# Patient Record
Sex: Male | Born: 1955 | Race: White | Hispanic: No | Marital: Married | Smoking: Never smoker
Health system: Southern US, Community
[De-identification: ages and names within clinical notes are randomized; demographics above are authoritative.]

## PROBLEM LIST (undated history)

## (undated) DIAGNOSIS — N400 Enlarged prostate without lower urinary tract symptoms: Secondary | ICD-10-CM

## (undated) DIAGNOSIS — I10 Essential (primary) hypertension: Secondary | ICD-10-CM

## (undated) DIAGNOSIS — E785 Hyperlipidemia, unspecified: Secondary | ICD-10-CM

## (undated) DIAGNOSIS — I2510 Atherosclerotic heart disease of native coronary artery without angina pectoris: Secondary | ICD-10-CM

## (undated) DIAGNOSIS — K219 Gastro-esophageal reflux disease without esophagitis: Secondary | ICD-10-CM

---

## 2005-09-26 ENCOUNTER — Emergency Department (HOSPITAL_COMMUNITY): Admission: EM | Admit: 2005-09-26 | Discharge: 2005-09-26 | Payer: Self-pay | Admitting: Emergency Medicine

## 2006-12-13 ENCOUNTER — Encounter: Admission: RE | Admit: 2006-12-13 | Discharge: 2006-12-13 | Payer: Self-pay | Admitting: Neurosurgery

## 2006-12-19 ENCOUNTER — Inpatient Hospital Stay (HOSPITAL_COMMUNITY): Admission: AD | Admit: 2006-12-19 | Discharge: 2006-12-20 | Payer: Self-pay | Admitting: Neurosurgery

## 2009-03-15 ENCOUNTER — Encounter: Admission: RE | Admit: 2009-03-15 | Discharge: 2009-03-15 | Payer: Self-pay | Admitting: Podiatry

## 2010-06-21 ENCOUNTER — Ambulatory Visit (HOSPITAL_COMMUNITY): Admission: RE | Admit: 2010-06-21 | Discharge: 2010-06-21 | Payer: Self-pay | Admitting: Podiatry

## 2010-12-17 ENCOUNTER — Encounter: Payer: Self-pay | Admitting: Podiatry

## 2011-04-13 NOTE — Op Note (Signed)
NAMEBEAUDEN, TREMONT NO.:  [id number]   MEDICAL RECORD NO.:  [id number]          PATIENT TYPE:  AMB   LOCATION:  SDS                          FACILITY:  [facility]   PHYSICIAN:  Clydene Fake, M.D.  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   PROCEDURES:  Anterior cervical decompression and diskectomy and fusion,  C5-6 and 6-7 with LifeMed allograft bone and Trestle anterior cervical  plate.   SURGEON:  Clydene Fake, M.D.   ASSISTANT:  Stefani Dama, M.D.   ANESTHESIA:  General endotracheal tube anesthesia.   ESTIMATED BLOOD LOSS:  Minimal.   BLOOD GIVEN:  None.   BLOOD LOSS:  None.   DRAINS:  None.   COMPLICATIONS:  None.   REASON FOR PROCEDURES:  The patient is a 55 year old gentleman, who has  had right arm pain and numbness that has not improved, and actually  worsened.  MRI was done showing rather severe spondylytic changes with  spurring and disk herniation at 5-6 and spondylytic changes at 6-7,  causing the  problem, greater on the left than on the right.  The  patient was brought in for decompression and fusion.   PROCEDURE IN DETAIL:  The patient was brought in the operating room and  general anesthesia induced.  The patient was placed in 10 pounds of  halter traction and prepped and draped in a sterile fashion.  The site  of incision was injected with 10 mL of 1% lidocaine with epinephrine.  An incision was then made in the midline to the anterior border of the  sternocleidomastoid on the left side of the neck at the site that had a  previous scar from thyroid surgery.  The incision was taken down through  the fascia to  the platysma, and hemostasis was obtained with Bovie  cauterization.  Carefully dissecting through the platysma, blunt  dissection was taken to the anterior cervical fascia to the anterior  cervical spine.  A needle was placed in the interspace and x-ray was  obtained.  The needle was in the 5-6 space.  We did another 2 x-rays, 1  with the needle up in 4-5 too, trying to confirm this.  We did confirm  that we were at the 5-6 space.  The disk space was incised with a 15  blade as the needle was removed.  Partial diskectomy was performed.  The  longus colli muscles were reflected laterally from C5 through 7.  The  self-retaining retractor was placed.  The disk spaces were then again  incised with a 15 blade and diskectomy performed at 5-6 and 6-7 with  pituitary rongeurs and curets.  Anterior osteophytes were removed with  Kerrison punches.  Distraction pins were placed in C5 and C7 and the  interspaces were distracted.  The  microscope was brought in for  microdissection.  Starting at 5-6, and, again, we continued to use  curets and pituitary rongeurs to remove the disk material, and 2 and 3-  mm Kerrisons were used to remove osteophytes.  As we got down near the  posterior disk and ligament, 1-mm Kerrison punches were used to start  removing this, and this was finished with the 2-mm Kerrison punches,  decompressing the central canal, and then performing bilateral  foraminotomies.  There was significant spondylytic change and disk in  the right foramen causing some nerve root compression.  When we were  finished, we had good decompression of both canals.  Gelfoam with  thrombin was placed in the disk space, and attention was then taken to 6-  7 and diskectomy continued there with pituitary rongeurs and curets, and  1 and 2-mm Kerrison punches were used to remove posterior disk,  posterior ligament and osteophytes, getting a good central decompression  and bilateral foraminotomies.  Then we  irrigated with antibiotic  solution and we got hemostasis with Gelfoam and thrombin.  We removed  the Gelfoam and thrombin from both spaces.  We measured the height of  the disk spaces to be 6 mm, and we roughened up the endplates with  the  LifeMed broaches for a 6-mm graft, and tapped a 6-mm bone plug into the  5-6 annulus and 6-7 level, countersinking it a millimeter or 2 and  checking posterior to the graft with a nerve hook, making sure there was  plenty of room between the bone graft and the dura.  We irrigated with  antibiotic solution.  The distraction and distraction pins were removed.  Hemostasis with Gelfoam and thrombin was done.  We removed the weight  from the traction and the bones were firmly in good position.  Anterior  osteophytes were removed with a Leksell rongeur and a Trestle anterior  cervical plate was placed over the anterior cervical spine.  Two screws  were placed into C5, 2 into C6 and 2 into C7, and these were tightened  down.  Lateral x-rays were obtained, and we could just see the screws in  C5 at the beginning of the plate due to the shoulders and body habitus.  We irrigated with antibiotic solution.  We had very good hemostasis, and  the platysma was closed with 3-0 Vicryl interrupted sutures.  The  subcutaneous tissues were closed with the same, and the skin closed with  benzoin and Steri-Strips.  A dressing was placed.  The patient was  placed in a soft cervical collar, awakened from anesthesia and  transferred to the recovery room in stable condition.           ______________________________  Clydene Fake, M.D.     JRH/MEDQ  D:  12/19/2006  T:  12/19/2006  Job:  [id number]

## 2015-11-27 HISTORY — PX: CORONARY ANGIOPLASTY WITH STENT PLACEMENT: SHX49

## 2016-08-26 DIAGNOSIS — I2510 Atherosclerotic heart disease of native coronary artery without angina pectoris: Secondary | ICD-10-CM

## 2016-08-26 HISTORY — DX: Atherosclerotic heart disease of native coronary artery without angina pectoris: I25.10

## 2018-09-08 ENCOUNTER — Observation Stay (HOSPITAL_COMMUNITY)
Admission: EM | Admit: 2018-09-08 | Discharge: 2018-09-10 | Disposition: A | Discharge status: Home / Self Care | Payer: 59 | Attending: Internal Medicine | Admitting: Internal Medicine

## 2018-09-08 ENCOUNTER — Emergency Department (HOSPITAL_COMMUNITY): Payer: 59

## 2018-09-08 ENCOUNTER — Encounter (HOSPITAL_COMMUNITY): Payer: Self-pay | Admitting: Emergency Medicine

## 2018-09-08 DIAGNOSIS — K219 Gastro-esophageal reflux disease without esophagitis: Secondary | ICD-10-CM | POA: Diagnosis not present

## 2018-09-08 DIAGNOSIS — Z955 Presence of coronary angioplasty implant and graft: Secondary | ICD-10-CM | POA: Diagnosis not present

## 2018-09-08 DIAGNOSIS — Z7982 Long term (current) use of aspirin: Secondary | ICD-10-CM | POA: Diagnosis not present

## 2018-09-08 DIAGNOSIS — N183 Chronic kidney disease, stage 3 unspecified: Secondary | ICD-10-CM

## 2018-09-08 DIAGNOSIS — E669 Obesity, unspecified: Secondary | ICD-10-CM | POA: Diagnosis not present

## 2018-09-08 DIAGNOSIS — Z8249 Family history of ischemic heart disease and other diseases of the circulatory system: Secondary | ICD-10-CM | POA: Diagnosis not present

## 2018-09-08 DIAGNOSIS — I129 Hypertensive chronic kidney disease with stage 1 through stage 4 chronic kidney disease, or unspecified chronic kidney disease: Secondary | ICD-10-CM | POA: Diagnosis not present

## 2018-09-08 DIAGNOSIS — Z6831 Body mass index (BMI) 31.0-31.9, adult: Secondary | ICD-10-CM | POA: Insufficient documentation

## 2018-09-08 DIAGNOSIS — Z7902 Long term (current) use of antithrombotics/antiplatelets: Secondary | ICD-10-CM | POA: Diagnosis not present

## 2018-09-08 DIAGNOSIS — I200 Unstable angina: Secondary | ICD-10-CM | POA: Diagnosis not present

## 2018-09-08 DIAGNOSIS — I2510 Atherosclerotic heart disease of native coronary artery without angina pectoris: Secondary | ICD-10-CM | POA: Diagnosis not present

## 2018-09-08 DIAGNOSIS — R079 Chest pain, unspecified: Secondary | ICD-10-CM | POA: Diagnosis not present

## 2018-09-08 DIAGNOSIS — Z79899 Other long term (current) drug therapy: Secondary | ICD-10-CM | POA: Diagnosis not present

## 2018-09-08 DIAGNOSIS — I10 Essential (primary) hypertension: Secondary | ICD-10-CM

## 2018-09-08 HISTORY — DX: Benign prostatic hyperplasia without lower urinary tract symptoms: N40.0

## 2018-09-08 HISTORY — DX: Atherosclerotic heart disease of native coronary artery without angina pectoris: I25.10

## 2018-09-08 HISTORY — DX: Hyperlipidemia, unspecified: E78.5

## 2018-09-08 HISTORY — DX: Essential (primary) hypertension: I10

## 2018-09-08 HISTORY — DX: Gastro-esophageal reflux disease without esophagitis: K21.9

## 2018-09-08 LAB — COMPREHENSIVE METABOLIC PANEL
ALK PHOS: 60 U/L (ref 38–126)
ALT: 21 U/L (ref 0–44)
ANION GAP: 8 (ref 5–15)
AST: 24 U/L (ref 15–41)
Albumin: 3.9 g/dL (ref 3.5–5.0)
BUN: 24 mg/dL — ABNORMAL HIGH (ref 8–23)
CALCIUM: 8.9 mg/dL (ref 8.9–10.3)
CHLORIDE: 108 mmol/L (ref 98–111)
CO2: 23 mmol/L (ref 22–32)
Creatinine, Ser: 2.13 mg/dL — ABNORMAL HIGH (ref 0.61–1.24)
GFR, EST AFRICAN AMERICAN: 36 mL/min — AB (ref >60)
GFR, EST NON AFRICAN AMERICAN: 31 mL/min — AB (ref >60)
GLUCOSE: 134 mg/dL — AB (ref 70–99)
Potassium: 4.3 mmol/L (ref 3.5–5.1)
Sodium: 139 mmol/L (ref 135–145)
Total Bilirubin: 1.8 mg/dL — ABNORMAL HIGH (ref 0.3–1.2)
Total Protein: 6.8 g/dL (ref 6.5–8.1)

## 2018-09-08 LAB — CBC WITH DIFFERENTIAL/PLATELET
Abs Immature Granulocytes: 0.05 10*3/uL (ref 0.00–0.07)
BASOS ABS: 0.1 10*3/uL (ref 0.0–0.1)
Basophils Relative: 1 %
EOS ABS: 0.2 10*3/uL (ref 0.0–0.5)
EOS PCT: 3 %
HEMATOCRIT: 45 % (ref 39.0–52.0)
HEMOGLOBIN: 15.5 g/dL (ref 13.0–17.0)
Immature Granulocytes: 1 %
LYMPHS ABS: 1.4 10*3/uL (ref 0.7–4.0)
LYMPHS PCT: 17 %
MCH: 32.6 pg (ref 26.0–34.0)
MCHC: 34.4 g/dL (ref 30.0–36.0)
MCV: 94.5 fL (ref 80.0–100.0)
MONOS PCT: 8 %
Monocytes Absolute: 0.6 10*3/uL (ref 0.1–1.0)
NRBC: 0 % (ref 0.0–0.2)
Neutro Abs: 5.7 10*3/uL (ref 1.7–7.7)
Neutrophils Relative %: 70 %
Platelets: 202 10*3/uL (ref 150–400)
RBC: 4.76 MIL/uL (ref 4.22–5.81)
RDW: 12.6 % (ref 11.5–15.5)
WBC: 8 10*3/uL (ref 4.0–10.5)

## 2018-09-08 LAB — TROPONIN I

## 2018-09-08 LAB — I-STAT TROPONIN, ED: Troponin i, poc: 0.01 ng/mL (ref 0.00–0.08)

## 2018-09-08 MED ORDER — ACETAMINOPHEN 325 MG PO TABS: 650.0000 mg | ORAL_TABLET | ORAL | Status: DC | PRN

## 2018-09-08 MED ORDER — CARVEDILOL 25 MG PO TABS
25.0000 mg | ORAL_TABLET | Freq: Two times a day (BID) | ORAL | Status: DC
  Administered 2018-09-09 – 2018-09-10 (×3): 25 mg via ORAL
  Filled 2018-09-08 (×3): qty 1

## 2018-09-08 MED ORDER — PANTOPRAZOLE SODIUM 40 MG PO TBEC
40.0000 mg | DELAYED_RELEASE_TABLET | Freq: Every day | ORAL | Status: DC
  Administered 2018-09-09 – 2018-09-10 (×2): 40 mg via ORAL
  Filled 2018-09-08 (×2): qty 1

## 2018-09-08 MED ORDER — VITAMIN D 1000 UNITS PO TABS
2000.0000 [IU] | ORAL_TABLET | Freq: Every day | ORAL | Status: DC
  Administered 2018-09-09 – 2018-09-10 (×2): 2000 [IU] via ORAL
  Filled 2018-09-08 (×2): qty 2

## 2018-09-08 MED ORDER — NITROGLYCERIN 0.4 MG SL SUBL: 0.4000 mg | SUBLINGUAL_TABLET | SUBLINGUAL | Status: DC | PRN

## 2018-09-08 MED ORDER — MORPHINE SULFATE (PF) 2 MG/ML IV SOLN
2.0000 mg | Freq: Once | INTRAVENOUS | Status: AC
  Administered 2018-09-08: 2 mg via INTRAVENOUS
  Filled 2018-09-08: qty 1

## 2018-09-08 MED ORDER — ONDANSETRON HCL 4 MG/2ML IJ SOLN
4.0000 mg | Freq: Four times a day (QID) | INTRAMUSCULAR | Status: DC | PRN
  Administered 2018-09-09: 4 mg via INTRAVENOUS
  Filled 2018-09-08: qty 2

## 2018-09-08 MED ORDER — AMLODIPINE BESYLATE 10 MG PO TABS
10.0000 mg | ORAL_TABLET | Freq: Every day | ORAL | Status: DC
  Administered 2018-09-09 – 2018-09-10 (×2): 10 mg via ORAL
  Filled 2018-09-08 (×2): qty 1

## 2018-09-08 MED ORDER — TICAGRELOR 60 MG PO TABS
60.0000 mg | ORAL_TABLET | Freq: Two times a day (BID) | ORAL | Status: DC
  Administered 2018-09-09 – 2018-09-10 (×4): 60 mg via ORAL
  Filled 2018-09-08 (×4): qty 1

## 2018-09-08 MED ORDER — IRBESARTAN 150 MG PO TABS
150.0000 mg | ORAL_TABLET | Freq: Every day | ORAL | Status: DC
  Administered 2018-09-09 – 2018-09-10 (×2): 150 mg via ORAL
  Filled 2018-09-08 (×2): qty 1

## 2018-09-08 MED ORDER — ASPIRIN EC 81 MG PO TBEC
81.0000 mg | DELAYED_RELEASE_TABLET | Freq: Every day | ORAL | Status: DC
  Administered 2018-09-09: 81 mg via ORAL
  Filled 2018-09-08: qty 1

## 2018-09-08 MED ORDER — SUCRALFATE 1 GM/10ML PO SUSP: 1.0000 g | Freq: Four times a day (QID) | ORAL | Status: DC | PRN

## 2018-09-08 MED ORDER — ONDANSETRON HCL 4 MG/2ML IJ SOLN
4.0000 mg | Freq: Once | INTRAMUSCULAR | Status: AC
  Administered 2018-09-08: 4 mg via INTRAVENOUS
  Filled 2018-09-08: qty 2

## 2018-09-08 MED ORDER — NITROGLYCERIN 2 % TD OINT
0.5000 [in_us] | TOPICAL_OINTMENT | Freq: Once | TRANSDERMAL | Status: AC
  Administered 2018-09-08: 0.5 [in_us] via TOPICAL
  Filled 2018-09-08: qty 1

## 2018-09-08 NOTE — ED Provider Notes (Signed)
[facility] EMERGENCY DEPARTMENT Provider Note   CSN: [id number] Arrival date & time: 09/08/18  1610     History   Chief Complaint Chief Complaint  Patient presents with  . Chest Pain    HPI Kristopher Byrd is a 62 y.o. male.  62 year old male with prior medical history as detailed below presents with complaint of chest pain.  Patient reports intermittent left-sided chest discomfort over the last 2 to 3 days.  Symptoms were worse today.  He arrives from work.  He reports approximately 1 hour left-sided dull chest pressure.  This pain radiated to his left arm and left jaw.  He was diaphoretic per EMS.  Initial blood pressure was also elevated per EMS report.  Upon arrival to the ED he feels significantly improved following administration of 324 mg of aspirin and 2 nitroglycerin sublingual's.  He reports that his last catheterization was in 2017 when he was stented to the LAD.  He denies more recent cardiac intervention.  The history is provided by the patient and medical records.  Chest Pain   This is a new problem. The current episode started more than 2 days ago. The problem occurs daily. The problem has been rapidly improving. The pain is associated with exertion. The pain is present in the substernal region. The pain is mild. The quality of the pain is described as pressure-like. The pain radiates to the left jaw and left shoulder. Duration of episode(s) is 1 hour.    Past Medical History:  Diagnosis Date  . H/O heart artery stent   . Hypertension     Patient Active Problem List   Diagnosis Date Noted  . Chest pain 09/08/2018       Home Medications    Prior to Admission medications   Not on File    Family History No family history on file.  Social History Social History   Tobacco Use  . Smoking status: Not on file  Substance Use Topics  . Alcohol use: Not on file  . Drug use: Not on file     Allergies   Other   Review of  Systems Review of Systems  Cardiovascular: Positive for chest pain.  All other systems reviewed and are negative.    Physical Exam Updated Vital Signs BP 113/67   Pulse 78   Temp 98.2 F (36.8 C) (Oral)   Resp 17   Ht 5\' 8"  (1.727 m)   Wt 91.6 kg   SpO2 96%   BMI 30.71 kg/m   Physical Exam  Constitutional: He is oriented to person, place, and time. He appears well-developed and well-nourished. No distress.  HENT:  Head: Normocephalic and atraumatic.  Mouth/Throat: Oropharynx is clear and moist.  Eyes: Pupils are equal, round, and reactive to light. Conjunctivae and EOM are normal.  Neck: Normal range of motion. Neck supple.  Cardiovascular: Normal rate, regular rhythm and normal heart sounds.  Pulmonary/Chest: Effort normal and breath sounds normal. No respiratory distress.  Abdominal: Soft. He exhibits no distension. There is no tenderness.  Musculoskeletal: Normal range of motion. He exhibits no edema or deformity.  Neurological: He is alert and oriented to person, place, and time.  Skin: Skin is warm and dry.  Psychiatric: He has a normal mood and affect.  Nursing note and vitals reviewed.    ED Treatments / Results  Labs (all labs ordered are listed, but only abnormal results are displayed) Labs Reviewed  COMPREHENSIVE METABOLIC PANEL - Abnormal; Notable for  the following components:      Result Value   Glucose, Bld 134 (*)    BUN 24 (*)    Creatinine, Ser 2.13 (*)    Total Bilirubin 1.8 (*)    GFR calc non Af Amer 31 (*)    GFR calc Af Amer 36 (*)    All other components within normal limits  CBC WITH DIFFERENTIAL/PLATELET  URINALYSIS, ROUTINE W REFLEX MICROSCOPIC  I-STAT TROPONIN, ED    EKG EKG Interpretation  Date/Time:  Monday September 08 2018 16:19:06 EDT Ventricular Rate:  78 PR Interval:    QRS Duration: 98 QT Interval:  376 QTC Calculation: 429 R Axis:   31 Text Interpretation:  Sinus rhythm Low voltage, precordial leads Confirmed by  Kristine Royal [phone]) on 09/08/2018 4:34:18 PM   Radiology Dg Chest Port 1 View  Result Date: 09/08/2018 CLINICAL DATA:  Central and left-sided chest pain for 2 hours with diaphoresis. EXAM: PORTABLE CHEST 1 VIEW COMPARISON:  08/29/2016 and 11/04/2013. FINDINGS: 1700 hours. Suboptimal inspiration with minimal bibasilar atelectasis, similar to previous study. There is no confluent airspace opacity, edema, pleural effusion or pneumothorax. The heart size and mediastinal contours are stable. Previous cervical fusion mild thoracic spondylosis are noted. Telemetry leads overlie the chest. IMPRESSION: Stable chest.  No active cardiopulmonary process. Electronically Signed   By: Carey Bullocks M.D.   On: 09/08/2018 17:19    Procedures Procedures (including critical care time)  Medications Ordered in ED Medications  morphine 2 MG/ML injection 2 mg (2 mg Intravenous Given 09/08/18 1727)  nitroGLYCERIN (NITROGLYN) 2 % ointment 0.5 inch (0.5 inches Topical Given 09/08/18 1645)  ondansetron (ZOFRAN) injection 4 mg (4 mg Intravenous Given 09/08/18 1724)     Initial Impression / Assessment and Plan / ED Course  I have reviewed the triage vital signs and the nursing notes.  Pertinent labs & imaging results that were available during my care of the patient were reviewed by me and considered in my medical decision making (see chart for details).     MDM  Screen complete  Patient is presenting for evaluation of reported chest discomfort.  Initial troponin is reassuring.  Initial EKG is also reassuring.  However given patient's prior history of CAD I feel the patient requires inpatient work-up for possible ACS.  Medicine service is aware of case and will evaluate for admission.  Patient understands plan of care.  Final Clinical Impressions(s) / ED Diagnoses   Final diagnoses:  Chest pain, unspecified type    ED Discharge Orders    None       Wynetta Fines, MD 09/08/18 1906

## 2018-09-08 NOTE — H&P (Addendum)
History and Physical    Kristopher Byrd ZDG:[id number] DOB: 12/21/1955 DOA: 09/08/2018  PCP: Patient, No Pcp Per   Patient coming from: Home  Chief Complaint: Chest pain  HPI: Kristopher Byrd is a 62 y.o. male with medical history significant of hypertension, hyperlipidemia, coronary artery disease status post stent, CKD stage II presenting to the hospital for further evaluation of chest pain.  Patient states at 1 PM while driving he started experiencing nausea, left arm pain, fingers of left hand tingling, and left-sided jaw numbness at work.  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  States then 2 hours later he felt flushed, short of breath, nauseous, dizzy, and pale.  He felt like he was going to pass out.  No loss of consciousness.  No vomiting.  Reports having intermittent left-sided chest pain for the past 3 days.  States he has been taking aspirin and Brilinta since his stent was placed 2 years ago.  Denies having any nausea or chest pain at present.  States he has a cardiologist and nephrologist in [place].  Patient reports relief of chest pain with the medications he received by EMS.  ED Course: Vitals stable on arrival.  I-STAT troponin negative.  EKG with baseline wander and does not seem to be suggestive of ACS.  Chest x-ray showing no active cardiopulmonary disease.  Patient was given Aspirin 325 mg and nitroglycerin x2 by EMS. In addition, he received morphine 2 mg, nitroglycerin ointment 0.5 inch, and IV Zofran 4 mg in the ED.  TRH paged to admit.  Review of Systems: As per HPI otherwise 10 point review of systems negative.  Past Medical History:  Diagnosis Date  . H/O heart artery stent   . Hypertension     Social history  reports that he has never smoked. He has never used smokeless tobacco. He reports that he does not drink alcohol or use drugs.  Allergies  Allergen Reactions  . Menthol Shortness Of Breath and Other (See Comments)   Tachycardia  (Also Icy Hot and similar products)  . Chocolate Other (See Comments)    migraines  . Mushroom Extract Complex Nausea And Vomiting  . Niacin And Related Itching and Other (See Comments)    flushing    Family history Mother-heart disease, heart attack Sister-diabetes  Prior to Admission medications   Medication Sig Start Date End Date Taking? Authorizing Provider  amLODipine (NORVASC) 10 MG tablet Take 10 mg by mouth daily. 08/17/18  Yes [provider]  aspirin EC 81 MG tablet Take 81 mg by mouth See admin instructions. Take one tablet (81 mg) by mouth every morning, may also take one tablet (81 mg) at night as needed for pain   Yes [provider]  carvedilol (COREG) 25 MG tablet Take 25 mg by mouth 2 (two) times daily. 08/23/18  Yes [provider]  Cholecalciferol (VITAMIN D) 2000 units tablet Take 2,000 Units by mouth daily.   Yes [provider]  dexlansoprazole (DEXILANT) 60 MG capsule Take 60 mg by mouth daily.   Yes [provider]  nitroGLYCERIN (NITROSTAT) 0.4 MG SL tablet Place 0.4 mg under the tongue every 5 (five) minutes as needed for chest pain.  07/17/16  Yes [provider]  sucralfate (CARAFATE) 1 GM/10ML suspension Take 1 g by mouth 4 (four) times daily as needed (acid reflux).   Yes [provider]  telmisartan (MICARDIS) 80 MG tablet Take 80  mg by mouth daily. 06/22/18  Yes [provider]  Testosterone (TESTOPEL IL) 1 Dose by Implant route every 3 (three) months. Last injection end of August 2019 - administered in buttocks by Dr. Gerlene Burdock Puschinsky   Yes [provider]  ticagrelor (BRILINTA) 60 MG TABS tablet Take 60 mg by mouth 2 (two) times daily.   Yes [provider]  amoxicillin (AMOXIL) 500 MG capsule Take 1,000 mg by mouth every 8 (eight) hours. 7 day course filled 08/31/18 08/31/18   [provider]    Physical Exam: Vitals:   09/08/18 1900 09/08/18  1915 09/08/18 1930 09/08/18 2015  BP: 113/67 124/70 121/65 127/68  Pulse: 78 79 85 81  Resp: 17 16 18    Temp:    98 F (36.7 C)  TempSrc:    Oral  SpO2: 96% 94% 94% 97%  Weight:      Height:        Physical Exam  Constitutional: He is oriented to person, place, and time. He appears well-developed and well-nourished. No distress.  HENT:  Head: Normocephalic and atraumatic.  Mouth/Throat: Oropharynx is clear and moist.  Eyes: Right eye exhibits no discharge. Left eye exhibits no discharge.  Neck: Neck supple. No tracheal deviation present.  Cardiovascular: Normal rate, regular rhythm and intact distal pulses. Exam reveals no gallop and no friction rub.  Pulmonary/Chest: Effort normal and breath sounds normal. No respiratory distress. He has no wheezes. He has no rales.  Abdominal: Soft. Bowel sounds are normal. He exhibits no distension.  Musculoskeletal: He exhibits no edema.  Neurological: He is alert and oriented to person, place, and time.  Skin: Skin is warm and dry.  Psychiatric: His behavior is normal.     Labs on Admission: I have personally reviewed following labs and imaging studies  CBC: Recent Labs  Lab 09/08/18 1636  WBC 8.0  NEUTROABS 5.7  HGB 15.5  HCT 45.0  MCV 94.5  PLT 202   Basic Metabolic Panel: Recent Labs  Lab 09/08/18 1636  NA 139  K 4.3  CL 108  CO2 23  GLUCOSE 134*  BUN 24*  CREATININE 2.13*  CALCIUM 8.9   GFR: Estimated Creatinine Clearance: 38.5 mL/min (A) (by C-G formula based on SCr of 2.13 mg/dL (H)). Liver Function Tests: Recent Labs  Lab 09/08/18 1636  AST 24  ALT 21  ALKPHOS 60  BILITOT 1.8*  PROT 6.8  ALBUMIN 3.9   No results for input(s): LIPASE, AMYLASE in the last 168 hours. No results for input(s): AMMONIA in the last 168 hours. Coagulation Profile: No results for input(s): INR, PROTIME in the last 168 hours. Cardiac Enzymes: Recent Labs  Lab 09/08/18 2244  TROPONINI <0.03   BNP (last 3 results) No  results for input(s): PROBNP in the last 8760 hours. HbA1C: No results for input(s): HGBA1C in the last 72 hours. CBG: No results for input(s): GLUCAP in the last 168 hours. Lipid Profile: No results for input(s): CHOL, HDL, LDLCALC, TRIG, CHOLHDL, LDLDIRECT in the last 72 hours. Thyroid Function Tests: No results for input(s): TSH, T4TOTAL, FREET4, T3FREE, THYROIDAB in the last 72 hours. Anemia Panel: No results for input(s): VITAMINB12, FOLATE, FERRITIN, TIBC, IRON, RETICCTPCT in the last 72 hours. Urine analysis: No results found for: COLORURINE, APPEARANCEUR, LABSPEC, PHURINE, GLUCOSEU, HGBUR, BILIRUBINUR, KETONESUR, PROTEINUR, UROBILINOGEN, NITRITE, LEUKOCYTESUR  Radiological Exams on Admission: Dg Chest Port 1 View  Result Date: 09/08/2018 CLINICAL DATA:  Central and left-sided chest pain for 2 hours with diaphoresis. EXAM:  PORTABLE CHEST 1 VIEW COMPARISON:  08/29/2016 and 11/04/2013. FINDINGS: 1700 hours. Suboptimal inspiration with minimal bibasilar atelectasis, similar to previous study. There is no confluent airspace opacity, edema, pleural effusion or pneumothorax. The heart size and mediastinal contours are stable. Previous cervical fusion mild thoracic spondylosis are noted. Telemetry leads overlie the chest. IMPRESSION: Stable chest.  No active cardiopulmonary process. Electronically Signed   By: Carey Bullocks M.D.   On: 09/08/2018 17:19    EKG: Independently reviewed.  Sinus rhythm, baseline wander and does not seem to be suggestive of ACS.  Assessment/Plan Principal Problem:   Unstable angina (HCC) Active Problems:   CKD (chronic kidney disease), stage III (HCC)   Hypertension   GERD (gastroesophageal reflux disease)   Unstable angina Hemodynamically stable.  Troponin x2 negative.  EKG with baseline wander and does not seem to be suggestive of ACS. Cath done on August 31, 2016 at [facility] showing borderline severe stenosis of mid LAD, otherwise mild nonobstructive  coronary artery disease.  Patient underwent PCI of the mid LAD at that time.  Although patient has been on dual antiplatelet therapy, there is still concern for possible stent thrombosis vs new stenosis.  -Currently hemodynamically stable and chest pain-free -Trend troponin -Repeat EKG in a.m. -Echocardiogram -Heparin drip (discussed with cardiology fellow).  Patient denied having any history of GI bleed, head trauma, or intracranial bleed. -Continue home Brilinta and aspirin -Sublingual nitroglycerin as needed -Continue home Coreg -IV Zofran PRN nausea -N.p.o. after midnight -Consult cardiology in the morning -Risk factor modification: Check A1c, lipid panel.  Patient is currently not on a statin. Reports having an allergic reaction (hives, itching) to a cholesterol medicine in the past.  CKD stage III Serum creatinine 2.1 and GFR 31.  Creatinine was 1.6 in August 2018 per chart review under care everywhere.  No recent baseline. -Continue to monitor; BMP in a.m. -Patient will need continued outpatient follow-up with nephrology.  Hypertension Currently normotensive.  Continue home amlodipine, irbesartan, and Coreg.  GERD -Continue PPI and sucralfate  DVT prophylaxis: Heparin Code Status: Patient wishes to be full code. Family Communication: No family present at bedside. Disposition Plan: Anticipate discharge in 1 to 2 days. Consults called: Please consult cardiology in the morning. Admission status: Observation   Kristopher Giovanni MD [facility] Pager [phone]  If 7PM-7AM, please contact night-coverage [email or web address] Password [email or web address]  09/09/2018, 2:25 AM

## 2018-09-08 NOTE — ED Notes (Signed)
Attempted to call report

## 2018-09-08 NOTE — ED Triage Notes (Signed)
Per EMS- pt c.o. Chest pain to center left chest for 2 hours. Pt was diaphoretic upon arrival. Pt was given 324 Asprin and 2 nitro, pain currently 2/10.

## 2018-09-09 ENCOUNTER — Encounter (HOSPITAL_COMMUNITY)
Admission: EM | Disposition: A | Discharge status: Home / Self Care | Payer: Self-pay | Source: Home / Self Care | Attending: Emergency Medicine

## 2018-09-09 ENCOUNTER — Encounter (HOSPITAL_COMMUNITY): Payer: Self-pay | Admitting: Physician Assistant

## 2018-09-09 ENCOUNTER — Observation Stay (HOSPITAL_BASED_OUTPATIENT_CLINIC_OR_DEPARTMENT_OTHER): Payer: 59

## 2018-09-09 DIAGNOSIS — N183 Chronic kidney disease, stage 3 unspecified: Secondary | ICD-10-CM

## 2018-09-09 DIAGNOSIS — I200 Unstable angina: Secondary | ICD-10-CM | POA: Diagnosis not present

## 2018-09-09 DIAGNOSIS — I10 Essential (primary) hypertension: Secondary | ICD-10-CM

## 2018-09-09 DIAGNOSIS — K219 Gastro-esophageal reflux disease without esophagitis: Secondary | ICD-10-CM | POA: Diagnosis not present

## 2018-09-09 DIAGNOSIS — R079 Chest pain, unspecified: Secondary | ICD-10-CM

## 2018-09-09 DIAGNOSIS — I2510 Atherosclerotic heart disease of native coronary artery without angina pectoris: Secondary | ICD-10-CM | POA: Diagnosis not present

## 2018-09-09 DIAGNOSIS — I129 Hypertensive chronic kidney disease with stage 1 through stage 4 chronic kidney disease, or unspecified chronic kidney disease: Secondary | ICD-10-CM | POA: Diagnosis not present

## 2018-09-09 HISTORY — PX: LEFT HEART CATH AND CORONARY ANGIOGRAPHY: CATH118249

## 2018-09-09 LAB — LIPID PANEL
CHOL/HDL RATIO: 4.7 ratio
Cholesterol: 154 mg/dL (ref 0–200)
HDL: 33 mg/dL — ABNORMAL LOW (ref >40)
LDL CALC: 110 mg/dL — AB (ref 0–99)
Triglycerides: 55 mg/dL (ref <150)
VLDL: 11 mg/dL (ref 0–40)

## 2018-09-09 LAB — BASIC METABOLIC PANEL
Anion gap: 10 (ref 5–15)
BUN: 23 mg/dL (ref 8–23)
CALCIUM: 8.7 mg/dL — AB (ref 8.9–10.3)
CO2: 23 mmol/L (ref 22–32)
CREATININE: 1.95 mg/dL — AB (ref 0.61–1.24)
Chloride: 106 mmol/L (ref 98–111)
GFR calc Af Amer: 40 mL/min — ABNORMAL LOW (ref >60)
GFR, EST NON AFRICAN AMERICAN: 35 mL/min — AB (ref >60)
Glucose, Bld: 96 mg/dL (ref 70–99)
POTASSIUM: 4.2 mmol/L (ref 3.5–5.1)
SODIUM: 139 mmol/L (ref 135–145)

## 2018-09-09 LAB — HIV ANTIBODY (ROUTINE TESTING W REFLEX): HIV SCREEN 4TH GENERATION: NONREACTIVE

## 2018-09-09 LAB — HEMOGLOBIN A1C
Hgb A1c MFr Bld: 4.7 % — ABNORMAL LOW (ref 4.8–5.6)
Mean Plasma Glucose: 88.19 mg/dL

## 2018-09-09 LAB — ECHOCARDIOGRAM COMPLETE
Height: 68 in
Weight: 3265.6 oz

## 2018-09-09 LAB — TROPONIN I: Troponin I: <0.03 ng/mL (ref <0.03)

## 2018-09-09 LAB — HEPARIN LEVEL (UNFRACTIONATED): HEPARIN UNFRACTIONATED: 1.05 [IU]/mL — AB (ref 0.30–0.70)

## 2018-09-09 SURGERY — LEFT HEART CATH AND CORONARY ANGIOGRAPHY
Anesthesia: LOCAL

## 2018-09-09 MED ORDER — LIDOCAINE HCL (PF) 1 % IJ SOLN
INTRAMUSCULAR | Status: AC
  Filled 2018-09-09: qty 30

## 2018-09-09 MED ORDER — IOHEXOL 350 MG/ML SOLN
INTRAVENOUS | Status: DC | PRN
  Administered 2018-09-09: 60 mL via INTRA_ARTERIAL

## 2018-09-09 MED ORDER — HEPARIN (PORCINE) IN NACL 1000-0.9 UT/500ML-% IV SOLN
INTRAVENOUS | Status: AC
  Filled 2018-09-09: qty 1000

## 2018-09-09 MED ORDER — SODIUM CHLORIDE 0.9 % IV SOLN: INTRAVENOUS | Status: DC

## 2018-09-09 MED ORDER — ISOSORBIDE MONONITRATE ER 30 MG PO TB24
30.0000 mg | ORAL_TABLET | Freq: Every day | ORAL | Status: DC
  Administered 2018-09-09 – 2018-09-10 (×2): 30 mg via ORAL
  Filled 2018-09-09 (×2): qty 1

## 2018-09-09 MED ORDER — ASPIRIN EC 81 MG PO TBEC: 81.0000 mg | DELAYED_RELEASE_TABLET | Freq: Every day | ORAL | Status: DC

## 2018-09-09 MED ORDER — SODIUM CHLORIDE 0.9% FLUSH
3.0000 mL | Freq: Two times a day (BID) | INTRAVENOUS | Status: DC
  Administered 2018-09-10: 3 mL via INTRAVENOUS

## 2018-09-09 MED ORDER — SODIUM CHLORIDE 0.9% FLUSH: 3.0000 mL | INTRAVENOUS | Status: DC | PRN

## 2018-09-09 MED ORDER — VERAPAMIL HCL 2.5 MG/ML IV SOLN
INTRAVENOUS | Status: DC | PRN
  Administered 2018-09-09: 10 mL via INTRA_ARTERIAL

## 2018-09-09 MED ORDER — FENTANYL CITRATE (PF) 100 MCG/2ML IJ SOLN
INTRAMUSCULAR | Status: AC
  Filled 2018-09-09: qty 2

## 2018-09-09 MED ORDER — HEPARIN SODIUM (PORCINE) 1000 UNIT/ML IJ SOLN
INTRAMUSCULAR | Status: DC | PRN
  Administered 2018-09-09: 5000 [IU] via INTRAVENOUS

## 2018-09-09 MED ORDER — VERAPAMIL HCL 2.5 MG/ML IV SOLN
INTRAVENOUS | Status: AC
  Filled 2018-09-09: qty 2

## 2018-09-09 MED ORDER — HEPARIN (PORCINE) IN NACL 100-0.45 UNIT/ML-% IJ SOLN
1200.0000 [IU]/h | INTRAMUSCULAR | Status: DC
  Administered 2018-09-09: 1200 [IU]/h via INTRAVENOUS
  Filled 2018-09-09: qty 250

## 2018-09-09 MED ORDER — HEPARIN SODIUM (PORCINE) 5000 UNIT/ML IJ SOLN
5000.0000 [IU] | Freq: Three times a day (TID) | INTRAMUSCULAR | Status: DC
  Administered 2018-09-09: 5000 [IU] via SUBCUTANEOUS
  Filled 2018-09-09 (×2): qty 1

## 2018-09-09 MED ORDER — SODIUM CHLORIDE 0.9 % IV SOLN: 250.0000 mL | INTRAVENOUS | Status: DC | PRN

## 2018-09-09 MED ORDER — SODIUM CHLORIDE 0.9 % WEIGHT BASED INFUSION: 3.0000 mL/kg/h | INTRAVENOUS | Status: DC

## 2018-09-09 MED ORDER — SODIUM CHLORIDE 0.9 % WEIGHT BASED INFUSION: 1.0000 mL/kg/h | INTRAVENOUS | Status: DC

## 2018-09-09 MED ORDER — MIDAZOLAM HCL 2 MG/2ML IJ SOLN
INTRAMUSCULAR | Status: AC
  Filled 2018-09-09: qty 2

## 2018-09-09 MED ORDER — SODIUM CHLORIDE 0.9 % WEIGHT BASED INFUSION
1.0000 mL/kg/h | INTRAVENOUS | Status: AC
  Administered 2018-09-09: 1 mL/kg/h via INTRAVENOUS

## 2018-09-09 MED ORDER — ASPIRIN 81 MG PO CHEW: 81.0000 mg | CHEWABLE_TABLET | ORAL | Status: DC

## 2018-09-09 MED ORDER — HEPARIN BOLUS VIA INFUSION
4000.0000 [IU] | Freq: Once | INTRAVENOUS | Status: AC
  Administered 2018-09-09: 4000 [IU] via INTRAVENOUS
  Filled 2018-09-09: qty 4000

## 2018-09-09 MED ORDER — HEPARIN (PORCINE) IN NACL 100-0.45 UNIT/ML-% IJ SOLN: 950.0000 [IU]/h | INTRAMUSCULAR | Status: DC

## 2018-09-09 MED ORDER — LIDOCAINE HCL (PF) 1 % IJ SOLN
INTRAMUSCULAR | Status: DC | PRN
  Administered 2018-09-09: 2 mL

## 2018-09-09 MED ORDER — PERFLUTREN LIPID MICROSPHERE
1.0000 mL | INTRAVENOUS | Status: AC | PRN
  Administered 2018-09-09: 2 mL via INTRAVENOUS
  Filled 2018-09-09: qty 10

## 2018-09-09 MED ORDER — HEPARIN (PORCINE) IN NACL 1000-0.9 UT/500ML-% IV SOLN
INTRAVENOUS | Status: DC | PRN
  Administered 2018-09-09 (×2): 500 mL

## 2018-09-09 MED ORDER — SODIUM CHLORIDE 0.9% FLUSH: 3.0000 mL | Freq: Two times a day (BID) | INTRAVENOUS | Status: DC

## 2018-09-09 MED ORDER — SODIUM CHLORIDE 0.9 % WEIGHT BASED INFUSION
3.0000 mL/kg/h | INTRAVENOUS | Status: DC
  Administered 2018-09-09: 3 mL/kg/h via INTRAVENOUS

## 2018-09-09 MED ORDER — MIDAZOLAM HCL 2 MG/2ML IJ SOLN
INTRAMUSCULAR | Status: DC | PRN
  Administered 2018-09-09: 1 mg via INTRAVENOUS

## 2018-09-09 MED ORDER — FENTANYL CITRATE (PF) 100 MCG/2ML IJ SOLN
INTRAMUSCULAR | Status: DC | PRN
  Administered 2018-09-09: 25 ug via INTRAVENOUS

## 2018-09-09 MED ORDER — LOPERAMIDE HCL 2 MG PO CAPS
2.0000 mg | ORAL_CAPSULE | ORAL | Status: DC | PRN
  Administered 2018-09-09: 2 mg via ORAL
  Filled 2018-09-09: qty 1

## 2018-09-09 SURGICAL SUPPLY — 19 items
CATH 5FR JL3.5 JR4 ANG PIG MP (CATHETERS) ×1 IMPLANT
CATH INFINITI 5FR JK (CATHETERS) ×1 IMPLANT
CATH LAUNCHER 5F AL1 (CATHETERS) IMPLANT
CATH LAUNCHER 5F EBU4.0 (CATHETERS) ×2 IMPLANT
CATH LAUNCHER 5F JL3 (CATHETERS) IMPLANT
CATH LAUNCHER 5F JL5 (CATHETERS) IMPLANT
CATH LAUNCHER 5F RADL (CATHETERS) IMPLANT
CATHETER LAUNCHER 5F AL1 (CATHETERS) ×2
CATHETER LAUNCHER 5F JL3 (CATHETERS) ×2
CATHETER LAUNCHER 5F JL5 (CATHETERS) ×2
CATHETER LAUNCHER 5F RADL (CATHETERS) ×2
DEVICE RAD COMP TR BAND LRG (VASCULAR PRODUCTS) ×1 IMPLANT
GLIDESHEATH SLEND SS 6F .021 (SHEATH) ×2 IMPLANT
GUIDEWIRE INQWIRE 1.5J.035X260 (WIRE) IMPLANT
INQWIRE 1.5J .035X260CM (WIRE) ×2
KIT HEART LEFT (KITS) ×2 IMPLANT
PACK CARDIAC CATHETERIZATION (CUSTOM PROCEDURE TRAY) ×2 IMPLANT
TRANSDUCER W/STOPCOCK (MISCELLANEOUS) ×2 IMPLANT
TUBING CIL FLEX 10 FLL-RA (TUBING) ×2 IMPLANT

## 2018-09-09 NOTE — Progress Notes (Signed)
ANTICOAGULATION CONSULT NOTE   Pharmacy Consult for Heparin  Indication: chest pain/ACS  Allergies  Allergen Reactions  . Menthol Shortness Of Breath and Other (See Comments)    Tachycardia  (Also Icy Hot and similar products)  . Chocolate Other (See Comments)    migraines  . Mushroom Extract Complex Nausea And Vomiting  . Niacin And Related Itching and Other (See Comments)    flushing   Patient Measurements: Height: 5\' 8"  (172.7 cm) Weight: 204 lb 1.6 oz (92.6 kg) IBW/kg (Calculated) : 68.4  Heparin dosing wt: ~88 kg  Vital Signs: Temp: 97.4 F (36.3 C) (10/15 0607) Temp Source: Oral (10/15 0607) BP: 137/61 (10/15 0915) Pulse Rate: 60 (10/15 0607)  Labs: Recent Labs    09/08/18 1636 09/08/18 2244 09/09/18 0338 09/09/18 0941  HGB 15.5  --   --   --   HCT 45.0  --   --   --   PLT 202  --   --   --   HEPARINUNFRC  --   --   --  1.05*  CREATININE 2.13*  --  1.95*  --   TROPONINI  --  <0.03 <0.03 <0.03    Estimated Creatinine Clearance: 42.3 mL/min (A) (by C-G formula based on SCr of 1.95 mg/dL (H)).   Assessment: 62 y/o M on heparin for r/o ACS. Trop levels negative thus far. Noted plan for cath tomorrow. Heparin level supratherapeutic (1.05) on gtt at 1200 units/hr. No bleeding noted.  Goal of Therapy:  Heparin level 0.3-0.7 units/ml Monitor platelets by anticoagulation protocol: Yes   Plan:  Hold heparin x 1 hour Restart heparin at 950 units/hr Will f/u 6 hr heparin level post restart  Christoper Fabian, PharmD, BCPS Clinical pharmacist  **Pharmacist phone directory can now be found on [email or web address] (PW [email or web address]).  Listed under [facility]. 09/09/2018,1:01 PM

## 2018-09-09 NOTE — Progress Notes (Signed)
PROGRESS NOTE  Kristopher Byrd ZOX:[id number] DOB: 14-Jan-1956 DOA: 09/08/2018 PCP: Patient, No Pcp Per   LOS: 0 days   Brief narrative:  Kristopher Byrd is a 62 y.o. male with medical history significant of hypertension, hyperlipidemia, coronary artery disease status post stent, CKD stage III presented to the hospital for further evaluation of chest pain. Patient was seen by cardiology and was thought to have unstable angina. Awaiting for cardiac cath.   Assessment/Plan:  Principal Problem:   Unstable angina (HCC) Active Problems:   CKD (chronic kidney disease), stage III (HCC)   Hypertension   GERD (gastroesophageal reflux disease)  Unstable angina.  Troponins were negative.  Had a cardiac catheterization in 2017 -Currently hemodynamically stable and chest pain-free .  on Brilinta and aspirin.  Plan is cardiac catheterization.  CKD stage III. Serum creatinine 2.1 and GFR 31.  Creatinine was 1.6 in August 2018 per chart review under care everywhere.  No recent baseline.   Continue with hydration.  Hypertension. Blood pressure is stable at this time.Marland Kitchen  on amlodipine, irbesartan, and Coreg.  GERD. Continue PPI and sucralfate  Code Status:  Full code   Family Communication: Spoke with the patient's wife at bedside.  Disposition Plan: Home  Consultants:  cardiology  Procedures:  None  Antibiotics:  none  HPI/Subjective: Patient denies any chest pain shortness of breath  Objective: Vitals:   09/09/18 0607 09/09/18 0915  BP: 128/62 137/61  Pulse: 60   Resp:    Temp: (!) 97.4 F (36.3 C)   SpO2: 95%     Intake/Output Summary (Last 24 hours) at 09/09/2018 1035 Last data filed at 09/09/2018 0937 Gross per 24 hour  Intake 4339.2 ml  Output 125 ml  Net 4214.2 ml   Filed Weights   09/08/18 1634 09/09/18 0604  Weight: 91.6 kg 92.6 kg    Physical Exam:  General: Alert awake, not in obvious distress.  Average built.  HEENT: PERRLA , oral mucosa is moist.  No  pharyngeal erythema  CVS: S1-S2 heard, no murmur.  Regular rate and rhythm.  Respiratory: Diminished breath sounds bilaterally.  No chest wall tenderness.  No crackles or wheezes  Abdomen: Soft, nontender bowel sounds are present.  Nondistended  CNS: Alert awake oriented.  No focal neurological deficits.  SKIN: Dry. No rashes.  Musculoskeletal: Normal muscle tone, power and bulk  Data Review: I have personally reviewed the following laboratory data and studies,  CBC: Recent Labs  Lab 09/08/18 1636  WBC 8.0  NEUTROABS 5.7  HGB 15.5  HCT 45.0  MCV 94.5  PLT 202   Basic Metabolic Panel: Recent Labs  Lab 09/08/18 1636 09/09/18 0338  NA 139 139  K 4.3 4.2  CL 108 106  CO2 23 23  GLUCOSE 134* 96  BUN 24* 23  CREATININE 2.13* 1.95*  CALCIUM 8.9 8.7*   Liver Function Tests: Recent Labs  Lab 09/08/18 1636  AST 24  ALT 21  ALKPHOS 60  BILITOT 1.8*  PROT 6.8  ALBUMIN 3.9   No results for input(s): LIPASE, AMYLASE in the last 168 hours. No results for input(s): AMMONIA in the last 168 hours. Cardiac Enzymes: Recent Labs  Lab 09/08/18 2244 09/09/18 0338  TROPONINI <0.03 <0.03   BNP (last 3 results) No results for input(s): BNP in the last 8760 hours.  ProBNP (last 3 results) No results for input(s): PROBNP in the last 8760 hours.  CBG: No results for input(s): GLUCAP in the last 168 hours. No results  found for this or any previous visit (from the past 240 hour(s)).   Studies: Dg Chest Port 1 View  Result Date: 09/08/2018 CLINICAL DATA:  Central and left-sided chest pain for 2 hours with diaphoresis. EXAM: PORTABLE CHEST 1 VIEW COMPARISON:  08/29/2016 and 11/04/2013. FINDINGS: 1700 hours. Suboptimal inspiration with minimal bibasilar atelectasis, similar to previous study. There is no confluent airspace opacity, edema, pleural effusion or pneumothorax. The heart size and mediastinal contours are stable. Previous cervical fusion mild thoracic spondylosis  are noted. Telemetry leads overlie the chest. IMPRESSION: Stable chest.  No active cardiopulmonary process. Electronically Signed   By: Carey Bullocks M.D.   On: 09/08/2018 17:19    Scheduled Meds: . amLODipine  10 mg Oral Daily  . [START ON 09/10/2018] aspirin  81 mg Oral Pre-Cath  . [START ON 09/11/2018] aspirin EC  81 mg Oral Daily  . carvedilol  25 mg Oral BID WC  . cholecalciferol  2,000 Units Oral Daily  . irbesartan  150 mg Oral Daily  . isosorbide mononitrate  30 mg Oral Daily  . pantoprazole  40 mg Oral Daily  . sodium chloride flush  3 mL Intravenous Q12H  . ticagrelor  60 mg Oral BID   Continuous Infusions: . sodium chloride    . sodium chloride    . sodium chloride 3 mL/kg/hr (09/09/18 1011)   Followed by  . sodium chloride    . heparin 1,200 Units/hr (09/09/18 0981)    Time spent: More than 50% of that time was spent in counseling and/or coordination of care.  [facility] Venie Montesinos  [facility] Pager [phone].  If 7PM-7AM, please contact night-coverage at [email or web address], password [facility] 09/09/2018, 10:35 AM

## 2018-09-09 NOTE — Progress Notes (Signed)
Echocardiogram 2D Echocardiogram has been performed.  [name] 09/09/2018, 11:33 AM

## 2018-09-09 NOTE — Interval H&P Note (Signed)
History and Physical Interval Note:  09/09/2018 2:27 PM  Kristopher Byrd  has presented today for surgery, with the diagnosis of ua - cad  The various methods of treatment have been discussed with the patient and family. After consideration of risks, benefits and other options for treatment, the patient has consented to  Procedure(s): LEFT HEART CATH AND CORONARY ANGIOGRAPHY (N/A) as a surgical intervention .  The patient's history has been reviewed, patient examined, no change in status, stable for surgery.  I have reviewed the patient's chart and labs.  Questions were answered to the patient's satisfaction.   Cath Lab Visit (complete for each Cath Lab visit)  Clinical Evaluation Leading to the Procedure:   ACS: Yes.    Non-ACS:    Anginal Classification: CCS IV  Anti-ischemic medical therapy: Maximal Therapy (2 or more classes of medications)  Non-Invasive Test Results: No non-invasive testing performed  Prior CABG: No previous CABG        Theron Arista [facility] 09/09/2018 2:27 PM

## 2018-09-09 NOTE — Consult Note (Addendum)
Cardiology Consultation:   Patient ID: Kristopher Byrd; [id number]; 1956/06/25   Admit date: 09/08/2018 Date of Consult: 09/09/2018  Primary Care Provider: Roque Cash, [facility] Primary Cardiologist: Dr Claudie Leach and Roque Cash in [facility] [place], new here Primary Electrophysiologist:  new   Patient Profile:   Kristopher Byrd is a 62 y.o. male with a hx of DES LAD 08/2016 at [facility], HTN, HLD, GERD, CKD, OA, BPH, agent Orange exposure, claustrophobia, diverticulosis, who is being seen today for the evaluation of chest pain at the request of Dr Louanne Belton.  History of Present Illness:   Kristopher Byrd is followed closely by his PCP. He had an [place] (?echo) a few months ago.   While driving yesterday, he had onset of L arm pain. This sometimes happens with GERD attacks, but never w/ angina. Then he got nauseated. By then, he was at the office. He was pale, by reports and light-headed. The nausea and other sx are not generally associated w/ GERD.   Since Friday, he has been having intermittent chest pain. The CP was stabbing and then burning. He would take ASA, no other rx tried. It would last about an hour.   He got the pain yesterday, it lasted about 4 hours, longer than the other episodes. The pain got to an 8/10. It was improved by nitro x 3. He got morphine and nitro paste in the ER. The pain finally resolved. It reminded him of his previous angina.  He has been getting SOB when he over-exerts himself. Has been told his heart muscle is a little weak.   This is the first episode of chest pain since 2017.  No LE edema, no orthopnea or PND.   No palpitations, no other recent presyncope or syncope.     Past Medical History:  Diagnosis Date  . BPH (benign prostatic hyperplasia)   . CAD (coronary artery disease) 08/2016   DES LAD at [facility]  . GERD (gastroesophageal reflux disease)   . Hyperlipidemia   . Hypertension     Past Surgical History:  Procedure Laterality Date  . CORONARY ANGIOPLASTY WITH STENT PLACEMENT   2017   DES LAD at [facility]     Prior to Admission medications   Medication Sig Start Date End Date Taking? Authorizing Provider  amLODipine (NORVASC) 10 MG tablet Take 10 mg by mouth daily. 08/17/18  Yes [provider]  aspirin EC 81 MG tablet Take 81 mg by mouth See admin instructions. Take one tablet (81 mg) by mouth every morning, may also take one tablet (81 mg) at night as needed for pain   Yes [provider]  carvedilol (COREG) 25 MG tablet Take 25 mg by mouth 2 (two) times daily. 08/23/18  Yes [provider]  Cholecalciferol (VITAMIN D) 2000 units tablet Take 2,000 Units by mouth daily.   Yes [provider]  dexlansoprazole (DEXILANT) 60 MG capsule Take 60 mg by mouth daily.   Yes [provider]  nitroGLYCERIN (NITROSTAT) 0.4 MG SL tablet Place 0.4 mg under the tongue every 5 (five) minutes as needed for chest pain.  07/17/16  Yes [provider]  sucralfate (CARAFATE) 1 GM/10ML suspension Take 1 g by mouth 4 (four) times daily as needed (acid reflux).   Yes [provider]  telmisartan (MICARDIS) 80 MG tablet Take 80 mg by mouth daily. 06/22/18  Yes [provider]  Testosterone (TESTOPEL IL) 1 Dose by Implant route every 3 (three) months. Last injection end of August 2019 -  administered in buttocks by Dr. Delfino Lovett Puschinsky   Yes [provider]  ticagrelor (BRILINTA) 60 MG TABS tablet Take 60 mg by mouth 2 (two) times daily.   Yes [provider]  amoxicillin (AMOXIL) 500 MG capsule Take 1,000 mg by mouth every 8 (eight) hours. 7 day course filled 08/31/18 08/31/18   [provider]    Inpatient Medications: Scheduled Meds: . amLODipine  10 mg Oral Daily  . aspirin EC  81 mg Oral Daily  . carvedilol  25 mg Oral BID WC  . cholecalciferol  2,000 Units Oral Daily  . irbesartan  150 mg Oral Daily  . pantoprazole  40 mg Oral Daily  . ticagrelor  60 mg Oral BID   Continuous Infusions: .  heparin 1,200 Units/hr (09/09/18 1610)   PRN Meds: acetaminophen, nitroGLYCERIN, ondansetron (ZOFRAN) IV, sucralfate  Allergies:    Allergies  Allergen Reactions  . Menthol Shortness Of Breath and Other (See Comments)    Tachycardia  (Also Icy Hot and similar products)  . Chocolate Other (See Comments)    migraines  . Mushroom Extract Complex Nausea And Vomiting  . Niacin And Related Itching and Other (See Comments)    flushing    Social History:   Social History   Socioeconomic History  . Marital status: Married    Spouse name: Not on file  . Number of children: Not on file  . Years of education: Not on file  . Highest education level: Not on file  Occupational History  . Not on file  Social Needs  . Financial resource strain: Not on file  . Food insecurity:    Worry: Not on file    Inability: Not on file  . Transportation needs:    Medical: Not on file    Non-medical: Not on file  Tobacco Use  . Smoking status: Never Smoker  . Smokeless tobacco: Never Used  Substance and Sexual Activity  . Alcohol use: Never    Frequency: Never  . Drug use: Never  . Sexual activity: Not on file  Lifestyle  . Physical activity:    Days per week: Not on file    Minutes per session: Not on file  . Stress: Not on file  Relationships  . Social connections:    Talks on phone: Not on file    Gets together: Not on file    Attends religious service: Not on file    Active member of club or organization: Not on file    Attends meetings of clubs or organizations: Not on file    Relationship status: Not on file  . Intimate partner violence:    Fear of current or ex partner: Not on file    Emotionally abused: Not on file    Physically abused: Not on file    Forced sexual activity: Not on file  Other Topics Concern  . Not on file  Social History Narrative  . Not on file    Family History:   Family History  Problem Relation Age of Onset  . Heart attack Mother 20       died  from complications  . Unexplained death Father        no info available   Family Status:  Family Status  Relation Name Status  . Mother  Deceased  . Father  Deceased  . Sister  Alive  . Brother  Alive    ROS:  Please see the history of present illness.  All  other ROS reviewed and negative.     Physical Exam/Data:   Vitals:   09/08/18 1930 09/08/18 2015 09/09/18 0604 09/09/18 0607  BP: 121/65 127/68  128/62  Pulse: 85 81  60  Resp: 18     Temp:  98 F (36.7 C)  (!) 97.4 F (36.3 C)  TempSrc:  Oral  Oral  SpO2: 94% 97%  95%  Weight:   92.6 kg   Height:        Intake/Output Summary (Last 24 hours) at 09/09/2018 0912 Last data filed at 09/09/2018 1540 Gross per 24 hour  Intake 4339.2 ml  Output 125 ml  Net 4214.2 ml   Filed Weights   09/08/18 1634 09/09/18 0604  Weight: 91.6 kg 92.6 kg   Body mass index is 31.03 kg/m.  General:  Well nourished, well developed, in no acute distress HEENT: normal Lymph: no adenopathy Neck: no JVD Endocrine:  No thryomegaly Vascular: No carotid bruits; 4/4 extremity pulses 2+, without bruits  Cardiac:  normal S1, S2; RRR; soft murmur  Lungs:  clear to auscultation bilaterally, no wheezing, rhonchi or rales  Abd: soft, nontender, no hepatomegaly  Ext: no edema Musculoskeletal:  No deformities, BUE and BLE strength normal and equal Skin: warm and dry  Neuro:  CNs 2-12 intact, no focal abnormalities noted Psych:  Normal affect   EKG:  The EKG was personally reviewed and demonstrates: 10/15, SR, HR 64, no acute ischemic changes, ?old inf infarct Telemetry:  Telemetry was personally reviewed and demonstrates:  SR, no sig ectopy  Relevant CV Studies:  ECHO: pending  CATH: 08/31/2016 at [facility] LMCA: Normal appearance with 0% stenosis. LAD: Abnormal. Proximal - 0% Mid - 70% Distal - 20%  Lesion on Mid LAD: 70% stenosis 13 mm length reduced to 0%. Pre procedure  TIMI III flow was noted. Post Procedure TIMI III flow was  present. The  lesion was diagnosed as Moderate Risk (B). The lesion was tubular and  eccentric.The lesion showed mild angulation and mild tortuosity.  !FFR !Stage/Medication !Dosage(mcg) !  !0.78 !Adenosine !140 !  +-----+----------+----+  Treatment results:Interventional treatment was successful.  Devices used  - PrimeWire PRESTIGE Pressure Guide Wire. Number of passes: 1.  - 2.5 mm X 12 mm Trek RX. 2 inflation(s) to a max pressure of: 16 atm.  - Xience Alpine 3.0x65m Everolimus DES. 1 inflation(s) to a max pressure  of: 12 atm.  - [place] Trek 3.25 mm X 12 mm. 1 inflation(s) to a max pressure of: 16 atm. LCx: Abnormal. Proximal - 20% RCA: Abnormal. Proximal - 30% Distal - 30%  LV gram was not done in order to reduce contrast exposure.He had a successful Xience DES to the LAD without complications   Laboratory Data:  Chemistry Recent Labs  Lab 09/08/18 1636 09/09/18 0338  NA 139 139  K 4.3 4.2  CL 108 106  CO2 23 23  GLUCOSE 134* 96  BUN 24* 23  CREATININE 2.13* 1.95*  CALCIUM 8.9 8.7*  GFRNONAA 31* 35*  GFRAA 36* 40*  ANIONGAP 8 10    Lab Results  Component Value Date   ALT 21 09/08/2018   AST 24 09/08/2018   ALKPHOS 60 09/08/2018   BILITOT 1.8 (H) 09/08/2018   Hematology Recent Labs  Lab 09/08/18 1636  WBC 8.0  RBC 4.76  HGB 15.5  HCT 45.0  MCV 94.5  MCH 32.6  MCHC 34.4  RDW 12.6  PLT 202   Cardiac Enzymes Recent Labs  Lab 09/08/18 2244  09/09/18 0338  TROPONINI <0.03 <0.03    Recent Labs  Lab 09/08/18 1642  TROPIPOC 0.01    BNPNo results for input(s): BNP, PROBNP in the last 168 hours.  DDimer No results for input(s): DDIMER in the last 168 hours. TSH: No results found for: TSH Lipids: Lab Results  Component Value Date   CHOL 154 09/09/2018   HDL 33 (L) 09/09/2018   LDLCALC 110 (H) 09/09/2018   TRIG 55 09/09/2018   CHOLHDL 4.7 09/09/2018   HgbA1c: Lab Results  Component Value Date   HGBA1C 4.7 (L) 09/09/2018    Magnesium: No results found for: MG   Radiology/Studies:  Dg Chest Port 1 View  Result Date: 09/08/2018 CLINICAL DATA:  Central and left-sided chest pain for 2 hours with diaphoresis. EXAM: PORTABLE CHEST 1 VIEW COMPARISON:  08/29/2016 and 11/04/2013. FINDINGS: 1700 hours. Suboptimal inspiration with minimal bibasilar atelectasis, similar to previous study. There is no confluent airspace opacity, edema, pleural effusion or pneumothorax. The heart size and mediastinal contours are stable. Previous cervical fusion mild thoracic spondylosis are noted. Telemetry leads overlie the chest. IMPRESSION: Stable chest.  No active cardiopulmonary process. Electronically Signed   By: Richardean Sale M.D.   On: 09/08/2018 17:19    Assessment and Plan:   Principal Problem: 1.  Unstable angina ([place]) - per pt, he had some partial blockages in 2017, rx medically. - apparently some LVD, records sent for from [facility] - no volume overload by exam. - CP improved and then resolved w/ nitrates and morphine - ez neg so far, ECG not acute - however, he is high-risk for IRS or progression of other dz - however 2.0, renal function is poor, unknown how much of this is acute vs chronic - since ez are negative, will add nitrates  - However cath may be indicated. - The risks and benefits of a cardiac catheterization including, but not limited to, death, stroke, MI, kidney damage and bleeding were discussed with the patient who indicates understanding and agrees to proceed.   Active Problems: 2.  CKD (chronic kidney disease), stage III ([place]) - labs from 06/2017 show BUN/Cr 18/1.61, now higher - will begin gentle hydration at 50 cc/hr  Otherwise, per IM   Hypertension   GERD (gastroesophageal reflux disease)     For questions or updates, please contact [facility] Please consult [email or web address] for contact info under Cardiology/STEMI.   SignedRosaria Ferries, PA-C  09/09/2018 9:12 AM    Attending Note:   The patient was seen and examined.  Agree with assessment and plan as noted above.  Changes made to the above note as needed.  Patient seen and independently examined with Rosaria Ferries, PA .   We discussed all aspects of the encounter. I agree with the assessment and plan as stated above.  1.  Unstable angina: The patient has a history of coronary artery disease status post stenting in the past.  He now presents with episodes of unstable angina.  He said chest discomfort/tightness.  This radiated up to his neck and out to his arm.  It was associated with nausea.  Lasted for about 30 minutes. So far his troponin levels are negative. His EKG shows no ST or T wave changes.  Given his body habitus I do not think that a stress Myoview study would be useful.  He is moderately obese.  Because of his previous stenting I do not think that a coronary CT angiogram would be useful.  I recommended that  we proceed with heart catheterization with possible PCI.  We will need to hydrate him well because of his underlying chronic kidney disease. We discussed the risks, benefits, options of coronary artery disease.  He understands and agrees to proceed.  Start IV hydration now.   I have spent a total of 40 minutes with patient reviewing hospital  notes , telemetry, EKGs, labs and examining patient as well as establishing an assessment and plan that was discussed with the patient. > 50% of time was spent in direct patient care.    [name] Headings, Brooke Bonito., MD, [facility] 09/09/2018, 9:50 AM [address],  [place] Pager [phone]

## 2018-09-09 NOTE — H&P (View-Only) (Signed)
Cardiology Consultation:   Patient ID: Kristopher Byrd; [id number]; [birth year]   Admit date: 09/08/2018 Date of Consult: 09/09/2018  Primary Care Provider: Roque Cash, [facility] Primary Cardiologist: Dr Claudie Leach and Roque Cash in [facility], new here Primary Electrophysiologist:  new   Patient Profile:   Kristopher Byrd is a 62 y.o. male with a hx of DES LAD 08/2016 at [facility], HTN, HLD, GERD, CKD, OA, BPH, agent Orange exposure, claustrophobia, diverticulosis, who is being seen today for the evaluation of chest pain at the request of Dr Louanne Belton.  History of Present Illness:   Kristopher Byrd is followed closely by his PCP. He had an [place] (?echo) a few months ago.   While driving yesterday, he had onset of L arm pain. This sometimes happens with GERD attacks, but never w/ angina. Then he got nauseated. By then, he was at the office. He was pale, by reports and light-headed. The nausea and other sx are not generally associated w/ GERD.   Since Friday, he has been having intermittent chest pain. The CP was stabbing and then burning. He would take ASA, no other rx tried. It would last about an hour.   He got the pain yesterday, it lasted about 4 hours, longer than the other episodes. The pain got to an 8/10. It was improved by nitro x 3. He got morphine and nitro paste in the ER. The pain finally resolved. It reminded him of his previous angina.  He has been getting SOB when he over-exerts himself. Has been told his heart muscle is a little weak.   This is the first episode of chest pain since 2017.  No LE edema, no orthopnea or PND.   No palpitations, no other recent presyncope or syncope.     Past Medical History:  Diagnosis Date  . BPH (benign prostatic hyperplasia)   . CAD (coronary artery disease) 08/2016   DES LAD at [facility]  . GERD (gastroesophageal reflux disease)   . Hyperlipidemia   . Hypertension     Past Surgical History:  Procedure Laterality Date  . CORONARY ANGIOPLASTY WITH STENT PLACEMENT   2017   DES LAD at [facility]     Prior to Admission medications   Medication Sig Start Date End Date Taking? Authorizing Provider  amLODipine (NORVASC) 10 MG tablet Take 10 mg by mouth daily. 08/17/18  Yes [provider]  aspirin EC 81 MG tablet Take 81 mg by mouth See admin instructions. Take one tablet (81 mg) by mouth every morning, may also take one tablet (81 mg) at night as needed for pain   Yes [provider]  carvedilol (COREG) 25 MG tablet Take 25 mg by mouth 2 (two) times daily. 08/23/18  Yes [provider]  Cholecalciferol (VITAMIN D) 2000 units tablet Take 2,000 Units by mouth daily.   Yes [provider]  dexlansoprazole (DEXILANT) 60 MG capsule Take 60 mg by mouth daily.   Yes [provider]  nitroGLYCERIN (NITROSTAT) 0.4 MG SL tablet Place 0.4 mg under the tongue every 5 (five) minutes as needed for chest pain.  07/17/16  Yes [provider]  sucralfate (CARAFATE) 1 GM/10ML suspension Take 1 g by mouth 4 (four) times daily as needed (acid reflux).   Yes [provider]  telmisartan (MICARDIS) 80 MG tablet Take 80 mg by mouth daily. 06/22/18  Yes [provider]  Testosterone (TESTOPEL IL) 1 Dose by Implant route every 3 (three) months. Last injection end of August 2019 -  administered in buttocks by Dr. Delfino Lovett Puschinsky   Yes [provider]  ticagrelor (BRILINTA) 60 MG TABS tablet Take 60 mg by mouth 2 (two) times daily.   Yes [provider]  amoxicillin (AMOXIL) 500 MG capsule Take 1,000 mg by mouth every 8 (eight) hours. 7 day course filled 08/31/18 08/31/18   [provider]    Inpatient Medications: Scheduled Meds: . amLODipine  10 mg Oral Daily  . aspirin EC  81 mg Oral Daily  . carvedilol  25 mg Oral BID WC  . cholecalciferol  2,000 Units Oral Daily  . irbesartan  150 mg Oral Daily  . pantoprazole  40 mg Oral Daily  . ticagrelor  60 mg Oral BID   Continuous Infusions: .  heparin 1,200 Units/hr (09/09/18 8338)   PRN Meds: acetaminophen, nitroGLYCERIN, ondansetron (ZOFRAN) IV, sucralfate  Allergies:    Allergies  Allergen Reactions  . Menthol Shortness Of Breath and Other (See Comments)    Tachycardia  (Also Icy Hot and similar products)  . Chocolate Other (See Comments)    migraines  . Mushroom Extract Complex Nausea And Vomiting  . Niacin And Related Itching and Other (See Comments)    flushing    Social History:   Social History   Socioeconomic History  . Marital status: Married    Spouse name: Not on file  . Number of children: Not on file  . Years of education: Not on file  . Highest education level: Not on file  Occupational History  . Not on file  Social Needs  . Financial resource strain: Not on file  . Food insecurity:    Worry: Not on file    Inability: Not on file  . Transportation needs:    Medical: Not on file    Non-medical: Not on file  Tobacco Use  . Smoking status: Never Smoker  . Smokeless tobacco: Never Used  Substance and Sexual Activity  . Alcohol use: Never    Frequency: Never  . Drug use: Never  . Sexual activity: Not on file  Lifestyle  . Physical activity:    Days per week: Not on file    Minutes per session: Not on file  . Stress: Not on file  Relationships  . Social connections:    Talks on phone: Not on file    Gets together: Not on file    Attends religious service: Not on file    Active member of club or organization: Not on file    Attends meetings of clubs or organizations: Not on file    Relationship status: Not on file  . Intimate partner violence:    Fear of current or ex partner: Not on file    Emotionally abused: Not on file    Physically abused: Not on file    Forced sexual activity: Not on file  Other Topics Concern  . Not on file  Social History Narrative  . Not on file    Family History:   Family History  Problem Relation Age of Onset  . Heart attack Mother 46       died  from complications  . Unexplained death Father        no info available   Family Status:  Family Status  Relation Name Status  . Mother  Deceased  . Father  Deceased  . Sister  Alive  . Brother  Alive    ROS:  Please see the history of present illness.  All  other ROS reviewed and negative.     Physical Exam/Data:   Vitals:   09/08/18 1930 09/08/18 2015 09/09/18 0604 09/09/18 0607  BP: 121/65 127/68  128/62  Pulse: 85 81  60  Resp: 18     Temp:  98 F (36.7 C)  (!) 97.4 F (36.3 C)  TempSrc:  Oral  Oral  SpO2: 94% 97%  95%  Weight:   92.6 kg   Height:        Intake/Output Summary (Last 24 hours) at 09/09/2018 0912 Last data filed at 09/09/2018 7048 Gross per 24 hour  Intake 4339.2 ml  Output 125 ml  Net 4214.2 ml   Filed Weights   09/08/18 1634 09/09/18 0604  Weight: 91.6 kg 92.6 kg   Body mass index is 31.03 kg/m.  General:  Well nourished, well developed, in no acute distress HEENT: normal Lymph: no adenopathy Neck: no JVD Endocrine:  No thryomegaly Vascular: No carotid bruits; 4/4 extremity pulses 2+, without bruits  Cardiac:  normal S1, S2; RRR; soft murmur  Lungs:  clear to auscultation bilaterally, no wheezing, rhonchi or rales  Abd: soft, nontender, no hepatomegaly  Ext: no edema Musculoskeletal:  No deformities, BUE and BLE strength normal and equal Skin: warm and dry  Neuro:  CNs 2-12 intact, no focal abnormalities noted Psych:  Normal affect   EKG:  The EKG was personally reviewed and demonstrates: 10/15, SR, HR 64, no acute ischemic changes, ?old inf infarct Telemetry:  Telemetry was personally reviewed and demonstrates:  SR, no sig ectopy  Relevant CV Studies:  ECHO: pending  CATH: 08/31/2016 at [facility] LMCA: Normal appearance with 0% stenosis. LAD: Abnormal. Proximal - 0% Mid - 70% Distal - 20%  Lesion on Mid LAD: 70% stenosis 13 mm length reduced to 0%. Pre procedure  TIMI III flow was noted. Post Procedure TIMI III flow was  present. The  lesion was diagnosed as Moderate Risk (B). The lesion was tubular and  eccentric.The lesion showed mild angulation and mild tortuosity.  !FFR !Stage/Medication !Dosage(mcg) !  !0.78 !Adenosine !140 !  +-----+----------+----+  Treatment results:Interventional treatment was successful.  Devices used  - PrimeWire PRESTIGE Pressure Guide Wire. Number of passes: 1.  - 2.5 mm X 12 mm Trek RX. 2 inflation(s) to a max pressure of: 16 atm.  - Xience Alpine 3.0x17m Everolimus DES. 1 inflation(s) to a max pressure  of: 12 atm.  - [place] Trek 3.25 mm X 12 mm. 1 inflation(s) to a max pressure of: 16 atm. LCx: Abnormal. Proximal - 20% RCA: Abnormal. Proximal - 30% Distal - 30%  LV gram was not done in order to reduce contrast exposure.He had a successful Xience DES to the LAD without complications   Laboratory Data:  Chemistry Recent Labs  Lab 09/08/18 1636 09/09/18 0338  NA 139 139  K 4.3 4.2  CL 108 106  CO2 23 23  GLUCOSE 134* 96  BUN 24* 23  CREATININE 2.13* 1.95*  CALCIUM 8.9 8.7*  GFRNONAA 31* 35*  GFRAA 36* 40*  ANIONGAP 8 10    Lab Results  Component Value Date   ALT 21 09/08/2018   AST 24 09/08/2018   ALKPHOS 60 09/08/2018   BILITOT 1.8 (H) 09/08/2018   Hematology Recent Labs  Lab 09/08/18 1636  WBC 8.0  RBC 4.76  HGB 15.5  HCT 45.0  MCV 94.5  MCH 32.6  MCHC 34.4  RDW 12.6  PLT 202   Cardiac Enzymes Recent Labs  Lab 09/08/18 2244  09/09/18 0338  TROPONINI <0.03 <0.03    Recent Labs  Lab 09/08/18 1642  TROPIPOC 0.01    BNPNo results for input(s): BNP, PROBNP in the last 168 hours.  DDimer No results for input(s): DDIMER in the last 168 hours. TSH: No results found for: TSH Lipids: Lab Results  Component Value Date   CHOL 154 09/09/2018   HDL 33 (L) 09/09/2018   LDLCALC 110 (H) 09/09/2018   TRIG 55 09/09/2018   CHOLHDL 4.7 09/09/2018   HgbA1c: Lab Results  Component Value Date   HGBA1C 4.7 (L) 09/09/2018    Magnesium: No results found for: MG   Radiology/Studies:  Dg Chest Port 1 View  Result Date: 09/08/2018 CLINICAL DATA:  Central and left-sided chest pain for 2 hours with diaphoresis. EXAM: PORTABLE CHEST 1 VIEW COMPARISON:  08/29/2016 and 11/04/2013. FINDINGS: 1700 hours. Suboptimal inspiration with minimal bibasilar atelectasis, similar to previous study. There is no confluent airspace opacity, edema, pleural effusion or pneumothorax. The heart size and mediastinal contours are stable. Previous cervical fusion mild thoracic spondylosis are noted. Telemetry leads overlie the chest. IMPRESSION: Stable chest.  No active cardiopulmonary process. Electronically Signed   By: Richardean Sale M.D.   On: 09/08/2018 17:19    Assessment and Plan:   Principal Problem: 1.  Unstable angina ([place]) - per pt, he had some partial blockages in 2017, rx medically. - apparently some LVD, records sent for from [facility] - no volume overload by exam. - CP improved and then resolved w/ nitrates and morphine - ez neg so far, ECG not acute - however, he is high-risk for IRS or progression of other dz - however 2.0, renal function is poor, unknown how much of this is acute vs chronic - since ez are negative, will add nitrates  - However cath may be indicated. - The risks and benefits of a cardiac catheterization including, but not limited to, death, stroke, MI, kidney damage and bleeding were discussed with the patient who indicates understanding and agrees to proceed.   Active Problems: 2.  CKD (chronic kidney disease), stage III ([place]) - labs from 06/2017 show BUN/Cr 18/1.61, now higher - will begin gentle hydration at 50 cc/hr  Otherwise, per IM   Hypertension   GERD (gastroesophageal reflux disease)     For questions or updates, please contact [facility] Please consult [email or web address] for contact info under Cardiology/STEMI.   SignedRosaria Ferries, PA-C  09/09/2018 9:12 AM    Attending Note:   The patient was seen and examined.  Agree with assessment and plan as noted above.  Changes made to the above note as needed.  Patient seen and independently examined with Rosaria Ferries, PA .   We discussed all aspects of the encounter. I agree with the assessment and plan as stated above.  1.  Unstable angina: The patient has a history of coronary artery disease status post stenting in the past.  He now presents with episodes of unstable angina.  He said chest discomfort/tightness.  This radiated up to his neck and out to his arm.  It was associated with nausea.  Lasted for about 30 minutes. So far his troponin levels are negative. His EKG shows no ST or T wave changes.  Given his body habitus I do not think that a stress Myoview study would be useful.  He is moderately obese.  Because of his previous stenting I do not think that a coronary CT angiogram would be useful.  I recommended that  we proceed with heart catheterization with possible PCI.  We will need to hydrate him well because of his underlying chronic kidney disease. We discussed the risks, benefits, options of coronary artery disease.  He understands and agrees to proceed.  Start IV hydration now.   I have spent a total of 40 minutes with patient reviewing hospital  notes , telemetry, EKGs, labs and examining patient as well as establishing an assessment and plan that was discussed with the patient. > 50% of time was spent in direct patient care.    Byrd Headings, Brooke Bonito., MD, [facility] 09/09/2018, 9:50 AM [address]  [place] Pager [phone]

## 2018-09-09 NOTE — Progress Notes (Signed)
ANTICOAGULATION CONSULT NOTE - Initial Consult  Pharmacy Consult for Heparin  Indication: chest pain/ACS  Allergies  Allergen Reactions  . Menthol Shortness Of Breath and Other (See Comments)    Tachycardia  (Also Icy Hot and similar products)  . Chocolate Other (See Comments)    migraines  . Mushroom Extract Complex Nausea And Vomiting  . Niacin And Related Itching and Other (See Comments)    flushing   Patient Measurements: Height: 5\' 8"  (172.7 cm) Weight: 202 lb (91.6 kg) IBW/kg (Calculated) : 68.4  Vital Signs: Temp: 98 F (36.7 C) (10/14 2015) Temp Source: Oral (10/14 2015) BP: 127/68 (10/14 2015) Pulse Rate: 81 (10/14 2015)  Labs: Recent Labs    09/08/18 1636 09/08/18 2244  HGB 15.5  --   HCT 45.0  --   PLT 202  --   CREATININE 2.13*  --   TROPONINI  --  <0.03    Estimated Creatinine Clearance: 38.5 mL/min (A) (by C-G formula based on SCr of 2.13 mg/dL (H)).   Medical History: Past Medical History:  Diagnosis Date  . H/O heart artery stent   . Hypertension     Assessment: 62 y/o M with central CP x 2 hours, initial troponin negative, starting heparin, CBC good, noted renal dysfunction, PTA meds reviewed.   Goal of Therapy:  Heparin level 0.3-0.7 units/ml Monitor platelets by anticoagulation protocol: Yes   Plan:  Heparin 4000 units BOLUS Start heparin drip at 1200 units/hr 0930 HL Daily CBC/HL Monitor for bleeding   Abran Duke 09/09/2018,12:55 AM

## 2018-09-10 ENCOUNTER — Encounter (HOSPITAL_COMMUNITY): Payer: Self-pay | Admitting: Cardiology

## 2018-09-10 DIAGNOSIS — R079 Chest pain, unspecified: Secondary | ICD-10-CM | POA: Diagnosis not present

## 2018-09-10 DIAGNOSIS — I200 Unstable angina: Secondary | ICD-10-CM | POA: Diagnosis not present

## 2018-09-10 DIAGNOSIS — I10 Essential (primary) hypertension: Secondary | ICD-10-CM | POA: Diagnosis not present

## 2018-09-10 DIAGNOSIS — N183 Chronic kidney disease, stage 3 (moderate): Secondary | ICD-10-CM | POA: Diagnosis not present

## 2018-09-10 DIAGNOSIS — K219 Gastro-esophageal reflux disease without esophagitis: Secondary | ICD-10-CM | POA: Diagnosis not present

## 2018-09-10 LAB — CBC
HCT: 36.9 % — ABNORMAL LOW (ref 39.0–52.0)
Hemoglobin: 12.3 g/dL — ABNORMAL LOW (ref 13.0–17.0)
MCH: 31.8 pg (ref 26.0–34.0)
MCHC: 33.3 g/dL (ref 30.0–36.0)
MCV: 95.3 fL (ref 80.0–100.0)
PLATELETS: 139 10*3/uL — AB (ref 150–400)
RBC: 3.87 MIL/uL — AB (ref 4.22–5.81)
RDW: 12.6 % (ref 11.5–15.5)
WBC: 6.4 10*3/uL (ref 4.0–10.5)
nRBC: 0 % (ref 0.0–0.2)

## 2018-09-10 LAB — BASIC METABOLIC PANEL
Anion gap: 5 (ref 5–15)
BUN: 21 mg/dL (ref 8–23)
CO2: 23 mmol/L (ref 22–32)
CREATININE: 1.9 mg/dL — AB (ref 0.61–1.24)
Calcium: 8.2 mg/dL — ABNORMAL LOW (ref 8.9–10.3)
Chloride: 111 mmol/L (ref 98–111)
GFR calc Af Amer: 41 mL/min — ABNORMAL LOW (ref >60)
GFR, EST NON AFRICAN AMERICAN: 36 mL/min — AB (ref >60)
GLUCOSE: 104 mg/dL — AB (ref 70–99)
Potassium: 4.1 mmol/L (ref 3.5–5.1)
Sodium: 139 mmol/L (ref 135–145)

## 2018-09-10 MED ORDER — ISOSORBIDE MONONITRATE ER 30 MG PO TB24: 30.0000 mg | ORAL_TABLET | Freq: Every day | ORAL | 3 refills | Status: AC

## 2018-09-10 NOTE — Discharge Summary (Signed)
Physician Discharge Summary  MANFRED LASPINA ZOX:[id number] DOB: 12-Jun-1956 DOA: 09/08/2018  PCP:   Admit date: 09/08/2018   Discharge date: 09/10/2018  Admitted From: Home  Disposition:  Home  Discharge Condition:Stable  CODE STATUS: Full  Diet recommendation: Heart Healthy  Brief/Interim Summary:  Jeffre Enriques Fordis a 62 y.o.malewith medical history significant ofhypertension, hyperlipidemia, coronary artery disease status post stent, CKD stage III presented to the hospital for further evaluation of chest pain. Patient was seen by cardiology and was thought to have unstable angina.    Patient was admitted to the hospital and following conditions were addressed during hospitalization,  Chest pain with history of coronary artery disease status post coronary stent.  Cardiology was consulted.  Patient underwent cardiac catheterization with findings of mild nonobstructive CAD.  Patient was advised to continue current medication including aspirin Brilinta.  Long-acting nitrate was added to the regimen.  Patient was advised to follow-up with his primary cardiologist after discharge.  Cardiology had an opinion that his current symptoms could be related to dyspepsia and GI etiology.  CKD stage III.   Patient's creatinine was 1.9 on discharge.  Will need outpatient follow-up.  Hypertension.   Patient was continued on on amlodipine, irbesartan, and Coreg.  GERD. Continue PPI and sucralfate.  Does have history of gallstones.  Patient was advised to follow-up with his primary care physician and talk about gallbladder surgery if he remains symptomatic with dyspeptic symptoms.  Disposition.  At this time patient is considered stable for disposition home.  He has been advised to follow-up with his primary care physician and cardiology as outpatient.  I also spoke with the patient's family at bedside regarding disposition.  Discharge Diagnoses:  Principal Problem:   Unstable angina (HCC) Active  Problems:   CKD (chronic kidney disease), stage III (HCC)   Hypertension   GERD (gastroesophageal reflux disease)   Discharge Instructions  Discharge Instructions    Diet - low sodium heart healthy   Complete by:  As directed    Discharge instructions   Complete by:  As directed    Follow up with your family physician in one week. Discuss about gallstones.  Follow-up with your cardiology in 2 to 3 weeks.   Increase activity slowly   Complete by:  As directed      Allergies as of 09/10/2018      Reactions   Menthol Shortness Of Breath, Other (See Comments)   Tachycardia  (Also Icy Hot and similar products)   Chocolate Other (See Comments)   migraines   Mushroom Extract Complex Nausea And Vomiting   Niacin And Related Itching, Other (See Comments)   flushing      Medication List    STOP taking these medications   amoxicillin 500 MG capsule Commonly known as:  AMOXIL     TAKE these medications   amLODipine 10 MG tablet Commonly known as:  NORVASC Take 10 mg by mouth daily.   aspirin EC 81 MG tablet Take 81 mg by mouth See admin instructions. Take one tablet (81 mg) by mouth every morning, may also take one tablet (81 mg) at night as needed for pain   BRILINTA 60 MG Tabs tablet Generic drug:  ticagrelor Take 60 mg by mouth 2 (two) times daily.   carvedilol 25 MG tablet Commonly known as:  COREG Take 25 mg by mouth 2 (two) times daily.   dexlansoprazole 60 MG capsule Commonly known as:  DEXILANT Take 60 mg by mouth daily.  isosorbide mononitrate 30 MG 24 hr tablet Commonly known as:  IMDUR Take 1 tablet (30 mg total) by mouth daily. Start taking on:  09/11/2018   nitroGLYCERIN 0.4 MG SL tablet Commonly known as:  NITROSTAT Place 0.4 mg under the tongue every 5 (five) minutes as needed for chest pain.   sucralfate 1 GM/10ML suspension Commonly known as:  CARAFATE Take 1 g by mouth 4 (four) times daily as needed (acid reflux).   telmisartan 80 MG  tablet Commonly known as:  MICARDIS Take 80 mg by mouth daily.   TESTOPEL IL 1 Dose by Implant route every 3 (three) months. Last injection end of August 2019 - administered in buttocks by Dr. Gerlene Burdock Puschinsky   Vitamin D 2000 units tablet Take 2,000 Units by mouth daily.       Allergies  Allergen Reactions  . Menthol Shortness Of Breath and Other (See Comments)    Tachycardia  (Also Icy Hot and similar products)  . Chocolate Other (See Comments)    migraines  . Mushroom Extract Complex Nausea And Vomiting  . Niacin And Related Itching and Other (See Comments)    flushing    Consultations:  *Cardiology   Procedures/Studies: Dg Chest Port 1 View  Result Date: 09/08/2018 CLINICAL DATA:  Central and left-sided chest pain for 2 hours with diaphoresis. EXAM: PORTABLE CHEST 1 VIEW COMPARISON:  08/29/2016 and 11/04/2013. FINDINGS: 1700 hours. Suboptimal inspiration with minimal bibasilar atelectasis, similar to previous study. There is no confluent airspace opacity, edema, pleural effusion or pneumothorax. The heart size and mediastinal contours are stable. Previous cervical fusion mild thoracic spondylosis are noted. Telemetry leads overlie the chest. IMPRESSION: Stable chest.  No active cardiopulmonary process. Electronically Signed   By: Carey Bullocks M.D.   On: 09/08/2018 17:19      Subjective: Patient denies any chest pain palpitation breath.  Discharge Exam: Vitals:   09/10/18 0452 09/10/18 0904  BP: (!) 122/55 128/62  Pulse: 70   Resp: 18   Temp: 97.9 F (36.6 C)   SpO2: 98%    Vitals:   09/09/18 1942 09/09/18 1943 09/10/18 0452 09/10/18 0904  BP: (!) 110/53  (!) 122/55 128/62  Pulse: 69  70   Resp: 16  18   Temp:  98.2 F (36.8 C) 97.9 F (36.6 C)   TempSrc:  Oral    SpO2:  99% 98%   Weight:   92.7 kg   Height:       General: Pt is alert, awake, not in acute distress Cardiovascular: RRR, S1/S2 +, no rubs, no gallops Respiratory: CTA bilaterally,  no wheezing, no rhonchi Abdominal: Soft, NT, ND, bowel sounds + CNS: non focal. Extremities: no edema, no cyanosis  The results of significant diagnostics from this hospitalization (including imaging, microbiology, ancillary and laboratory) are listed below for reference.    Microbiology: No results found for this or any previous visit (from the past 240 hour(s)).   Labs: BNP (last 3 results) No results for input(s): BNP in the last 8760 hours. Basic Metabolic Panel: Recent Labs  Lab 09/08/18 1636 09/09/18 0338 09/10/18 0344  NA 139 139 139  K 4.3 4.2 4.1  CL 108 106 111  CO2 23 23 23   GLUCOSE 134* 96 104*  BUN 24* 23 21  CREATININE 2.13* 1.95* 1.90*  CALCIUM 8.9 8.7* 8.2*   Liver Function Tests: Recent Labs  Lab 09/08/18 1636  AST 24  ALT 21  ALKPHOS 60  BILITOT 1.8*  PROT 6.8  ALBUMIN 3.9   No results for input(s): LIPASE, AMYLASE in the last 168 hours. No results for input(s): AMMONIA in the last 168 hours. CBC: Recent Labs  Lab 09/08/18 1636 09/10/18 0344  WBC 8.0 6.4  NEUTROABS 5.7  --   HGB 15.5 12.3*  HCT 45.0 36.9*  MCV 94.5 95.3  PLT 202 139*   Cardiac Enzymes: Recent Labs  Lab 09/08/18 2244 09/09/18 0338 09/09/18 0941  TROPONINI <0.03 <0.03 <0.03   BNP: Invalid input(s): POCBNP CBG: No results for input(s): GLUCAP in the last 168 hours. D-Dimer No results for input(s): DDIMER in the last 72 hours. Hgb A1c Recent Labs    09/09/18 0338  HGBA1C 4.7*   Lipid Profile Recent Labs    09/09/18 0338  CHOL 154  HDL 33*  LDLCALC 110*  TRIG 55  CHOLHDL 4.7   Thyroid function studies No results for input(s): TSH, T4TOTAL, T3FREE, THYROIDAB in the last 72 hours.  Invalid input(s): FREET3 Anemia work up No results for input(s): VITAMINB12, FOLATE, FERRITIN, TIBC, IRON, RETICCTPCT in the last 72 hours. Urinalysis No results found for: COLORURINE, APPEARANCEUR, LABSPEC, PHURINE, GLUCOSEU, HGBUR, BILIRUBINUR, KETONESUR, PROTEINUR,  UROBILINOGEN, NITRITE, LEUKOCYTESUR Sepsis Labs Invalid input(s): PROCALCITONIN,  WBC,  LACTICIDVEN Microbiology No results found for this or any previous visit (from the past 240 hour(s)).  Please note: You were cared for by a hospitalist during your hospital stay. Once you are discharged, your primary care physician will handle any further medical issues. Please note that NO REFILLS for any discharge medications will be authorized once you are discharged, as it is imperative that you return to your primary care physician (or establish a relationship with a primary care physician if you do not have one) for your post hospital discharge needs so that they can reassess your need for medications and monitor your lab values.  Time coordinating discharge: 40 minutes  SIGNED:  Joycelyn Das, MD  [facility] 09/10/2018, 10:08 AM

## 2018-09-10 NOTE — Progress Notes (Signed)
Removed TR band from Pt's right radial. Site is a level 1. Placed gauze and Tegaderm to site and educated to leave in place for 24 hours. Pt verbalized understanding.

## 2018-09-10 NOTE — Progress Notes (Addendum)
Progress Note  Patient Name: Kristopher Byrd Date of Encounter: 09/10/2018  Primary Cardiologist:  Dr Claudie Leach and Roque Cash in [facility], new here  Subjective   Pt feeling well today. Eager to go home. Cath with non-obstructive CAD, site with mild swelling but good pulses and cap refill. Denies chest pain.   Inpatient Medications    Scheduled Meds: . amLODipine  10 mg Oral Daily  . [START ON 09/11/2018] aspirin EC  81 mg Oral Daily  . carvedilol  25 mg Oral BID WC  . cholecalciferol  2,000 Units Oral Daily  . heparin  5,000 Units Subcutaneous Q8H  . irbesartan  150 mg Oral Daily  . isosorbide mononitrate  30 mg Oral Daily  . pantoprazole  40 mg Oral Daily  . sodium chloride flush  3 mL Intravenous Q12H  . ticagrelor  60 mg Oral BID   Continuous Infusions: . sodium chloride    . sodium chloride     PRN Meds: sodium chloride, acetaminophen, loperamide, nitroGLYCERIN, ondansetron (ZOFRAN) IV, sodium chloride flush, sucralfate   Vital Signs    Vitals:   09/09/18 1608 09/09/18 1942 09/09/18 1943 09/10/18 0452  BP: (!) 108/59 (!) 110/53  (!) 122/55  Pulse: 62 69  70  Resp:  16  18  Temp: 98 F (36.7 C)  98.2 F (36.8 C) 97.9 F (36.6 C)  TempSrc: Oral  Oral   SpO2: 99%  99% 98%  Weight:    92.7 kg  Height:        Intake/Output Summary (Last 24 hours) at 09/10/2018 0805 Last data filed at 09/09/2018 2200 Gross per 24 hour  Intake 556.8 ml  Output -  Net 556.8 ml   Filed Weights   09/08/18 1634 09/09/18 0604 09/10/18 0452  Weight: 91.6 kg 92.6 kg 92.7 kg    Physical Exam   General: Well developed, well nourished, NAD Skin: Warm, dry, intact  Head: Normocephalic, atraumatic, clear, moist mucus membranes. Neck: Negative for carotid bruits. No JVD Lungs:Clear to ausculation bilaterally. No wheezes, rales, or rhonchi. Breathing is unlabored. Cardiovascular: RRR with S1 S2. No murmurs, rubs, gallops, or LV heave appreciated. Abdomen: Soft, non-tender,  non-distended with normoactive bowel sounds.  No obvious abdominal masses. MSK: Strength and tone appear normal for age. 5/5 in all extremities Extremities: No edema. No clubbing or cyanosis. DP/PT pulses 2+ bilaterally Neuro: Alert and oriented. No focal deficits. No facial asymmetry. MAE spontaneously. Psych: Responds to questions appropriately with normal affect.    Labs    Chemistry Recent Labs  Lab 09/08/18 1636 09/09/18 0338 09/10/18 0344  NA 139 139 139  K 4.3 4.2 4.1  CL 108 106 111  CO2 _0 GLUCOSE 134* 96 104*  BUN 24* 23 21  CREATININE 2.13* 1.95* 1.90*  CALCIUM 8.9 8.7* 8.2*  PROT 6.8  --   --   ALBUMIN 3.9  --   --   AST 24  --   --   ALT 21  --   --   ALKPHOS 60  --   --   BILITOT 1.8*  --   --   GFRNONAA 31* 35* 36*  GFRAA 36* 40* 41*  ANIONGAP _1 Hematology Recent Labs  Lab 09/08/18 1636 09/10/18 0344  WBC 8.0 6.4  RBC 4.76 3.87*  HGB 15.5 12.3*  HCT 45.0 36.9*  MCV 94.5 95.3  MCH 32.6 31.8  MCHC 34.4 33.3  RDW 12.6 12.6  PLT 202 139*   Cardiac Enzymes Recent Labs  Lab 09/08/18 2244 09/09/18 0338 09/09/18 0941  TROPONINI <0.03 <0.03 <0.03    Recent Labs  Lab 09/08/18 1642  TROPIPOC 0.01     BNPNo results for input(s): BNP, PROBNP in the last 168 hours.   DDimer No results for input(s): DDIMER in the last 168 hours.   Radiology    Dg Chest Port 1 View  Result Date: 09/08/2018 CLINICAL DATA:  Central and left-sided chest pain for 2 hours with diaphoresis. EXAM: PORTABLE CHEST 1 VIEW COMPARISON:  08/29/2016 and 11/04/2013. FINDINGS: 1700 hours. Suboptimal inspiration with minimal bibasilar atelectasis, similar to previous study. There is no confluent airspace opacity, edema, pleural effusion or pneumothorax. The heart size and mediastinal contours are stable. Previous cervical fusion mild thoracic spondylosis are noted. Telemetry leads overlie the chest. IMPRESSION: Stable chest.  No active cardiopulmonary process.  Electronically Signed   By: Richardean Sale M.D.   On: 09/08/2018 17:19   Telemetry    09/10/18 NSR HR 70's  - Personally Reviewed  ECG     No new tracing as of 09/10/18- Personally Reviewed  Cardiac Studies   Cardiac catheterization 09/09/2018:]   Previously placed Prox LAD stent (unknown type) is widely patent.  Prox Cx lesion is 25% stenosed.  Prox RCA lesion is 25% stenosed.  LV end diastolic pressure is normal.   1. Nonobstructive CAD. The stent in the LAD is widely patent.  2. Normal LVEDP  Plan: medical management. Based on these results I feel his symptoms are more GI related.   Echocardiogram 09/09/2018:  Study Conclusions  - Left ventricle: The cavity size was normal. Systolic function was   normal. The estimated ejection fraction was in the range of 55%   to 60%. Wall motion was normal; there were no regional wall   motion abnormalities. Doppler parameters are consistent with   abnormal left ventricular relaxation (grade 1 diastolic   dysfunction). - Aortic valve: Valve area (VTI): 2.19 cm^2. Valve area (Vmax):   2.34 cm^2. Valve area (Vmean): 2.05 cm^2.  C[facility]: 08/31/2016 at [facility] LMCA: Normal appearance with 0% stenosis. LAD: Abnormal. Proximal - 0% Mid - 70% Distal - 20%  Lesion on Mid LAD: 70% stenosis 13 mm length reduced to 0%. Pre procedure  TIMI III flow was noted. Post Procedure TIMI III flow was present. The  lesion was diagnosed as Moderate Risk (B). The lesion was tubular and  eccentric.The lesion showed mild angulation and mild tortuosity.  !FFR !Stage/Medication !Dosage(mcg) !  !0.78 !Adenosine !140 !  +-----+----------+----+  Treatment results:Interventional treatment was successful.  Devices used  - PrimeWire PRESTIGE Pressure Guide Wire. Number of passes: 1.  - 2.5 mm X 12 mm Trek RX. 2 inflation(s) to a max pressure of: 16 atm.  - Xience Alpine 3.0x83m Everolimus DES. 1 inflation(s) to a max pressure  of: 12 atm.   - [place] Trek 3.25 mm X 12 mm. 1 inflation(s) to a max pressure of: 16 atm. LCx: Abnormal. Proximal - 20% RCA: Abnormal. Proximal - 30% Distal - 30%  LV gram was not done in order to reduce contrast exposure.He had a successful Xience DES to the LAD without complications  Patient Profile     62y.o. male [facility] HTN, HLD, GERD, CKD, OA, BPH, agent Orange exposure, claustrophobia, diverticulosis, who is being seen today for the evaluation of chest pain at the request of Dr PLouanne Belton  Assessment & Plan  1.  Unstable angina with history of DES/PCI to LAD 08/2016: -She with previous stenting presented to [facility] with unstable angina -Cardiac cath from 09/09/2018 with patent LAD stent, 25% LCx and 25% RCA>>>> nonobstructive CAD with plans for continued medical management.  Symptoms thought to GI etiology -Echocardiogram with normal LV function and G1 DD>>> no signs of fluid volume overload and no wall motion abnormalities -Cath site unremarkable -Continue ASA 81, carvedilol 25, irbesartan 150, isosorbide mononitrate 30, Brilinta 60 mg twice daily reduced dose per primary cardiologist  -Denies recurrent symptoms, cath site with mild swelling but adequate pulses and cap refill.   2.  CKD stage III: -Creatinine, 1.90 today>> 2.13 on presentation 09/08/2018 -Baseline appears to be in the>> -Continue to avoid nephrotoxic medications -Postprocedural hydration  3.  Hypertension: -Stable, 122/55, 110/53, 108/59 -Continue carvedilol 25, irbesartan 150, isosorbide mononitrate 30  4.  GERD: -Continue Protonix -Denies symptoms today   Signed, Kathyrn Drown NP-C [place] Pager: [phone] 09/10/2018, 8:05 AM     For questions or updates, please contact   Please consult [email or web address] for contact info under Cardiology/STEMI.  Attending Note:   The patient was seen and examined.  Agree with assessment and plan as noted above.  Changes made to the above note as  needed.  Patient seen and independently examined with Kathyrn Drown, NP .   We discussed all aspects of the encounter. I agree with the assessment and plan as stated above.  Chest discomfort: The patient presented with symptoms consistent with unstable angina.  He has a history of stenting to his LAD.  Heart catheterization yesterday revealed a patent LAD stent.  He has mild disease in the left circumflex artery in the right coronary artery.  We will continue medical therapy.  To be okay for discharge today.  He will follow-up with Roque Cash, PA.  2.  Chronic kidney disease: His creatinine is stable at 1.9. Follow-up with his primary medical doctor.   I have spent a total of 40 minutes with patient reviewing hospital  notes , telemetry, EKGs, labs and examining patient as well as establishing an assessment and plan that was discussed with the patient. > 50% of time was spent in direct patient care.  [facility] will sign off.   Medication Recommendations:  Continue current meds  Other recommendations (labs, testing, etc):   Follow up as an outpatient:  With Roque Cash, PA and primary MD     Ramond Dial., MD, [facility] 09/10/2018, 9:14 AM [address]  [place] Pager [phone]

## 2019-12-07 IMAGING — DX DG CHEST 1V PORT
1 series · 1 of 1 positions shown · non-contrast
Comparison: 08/29/2016 and 11/04/2013.

CLINICAL DATA: Central and left-sided chest pain for 2 hours with
diaphoresis.

EXAM:
PORTABLE CHEST 1 VIEW

[chest ap]
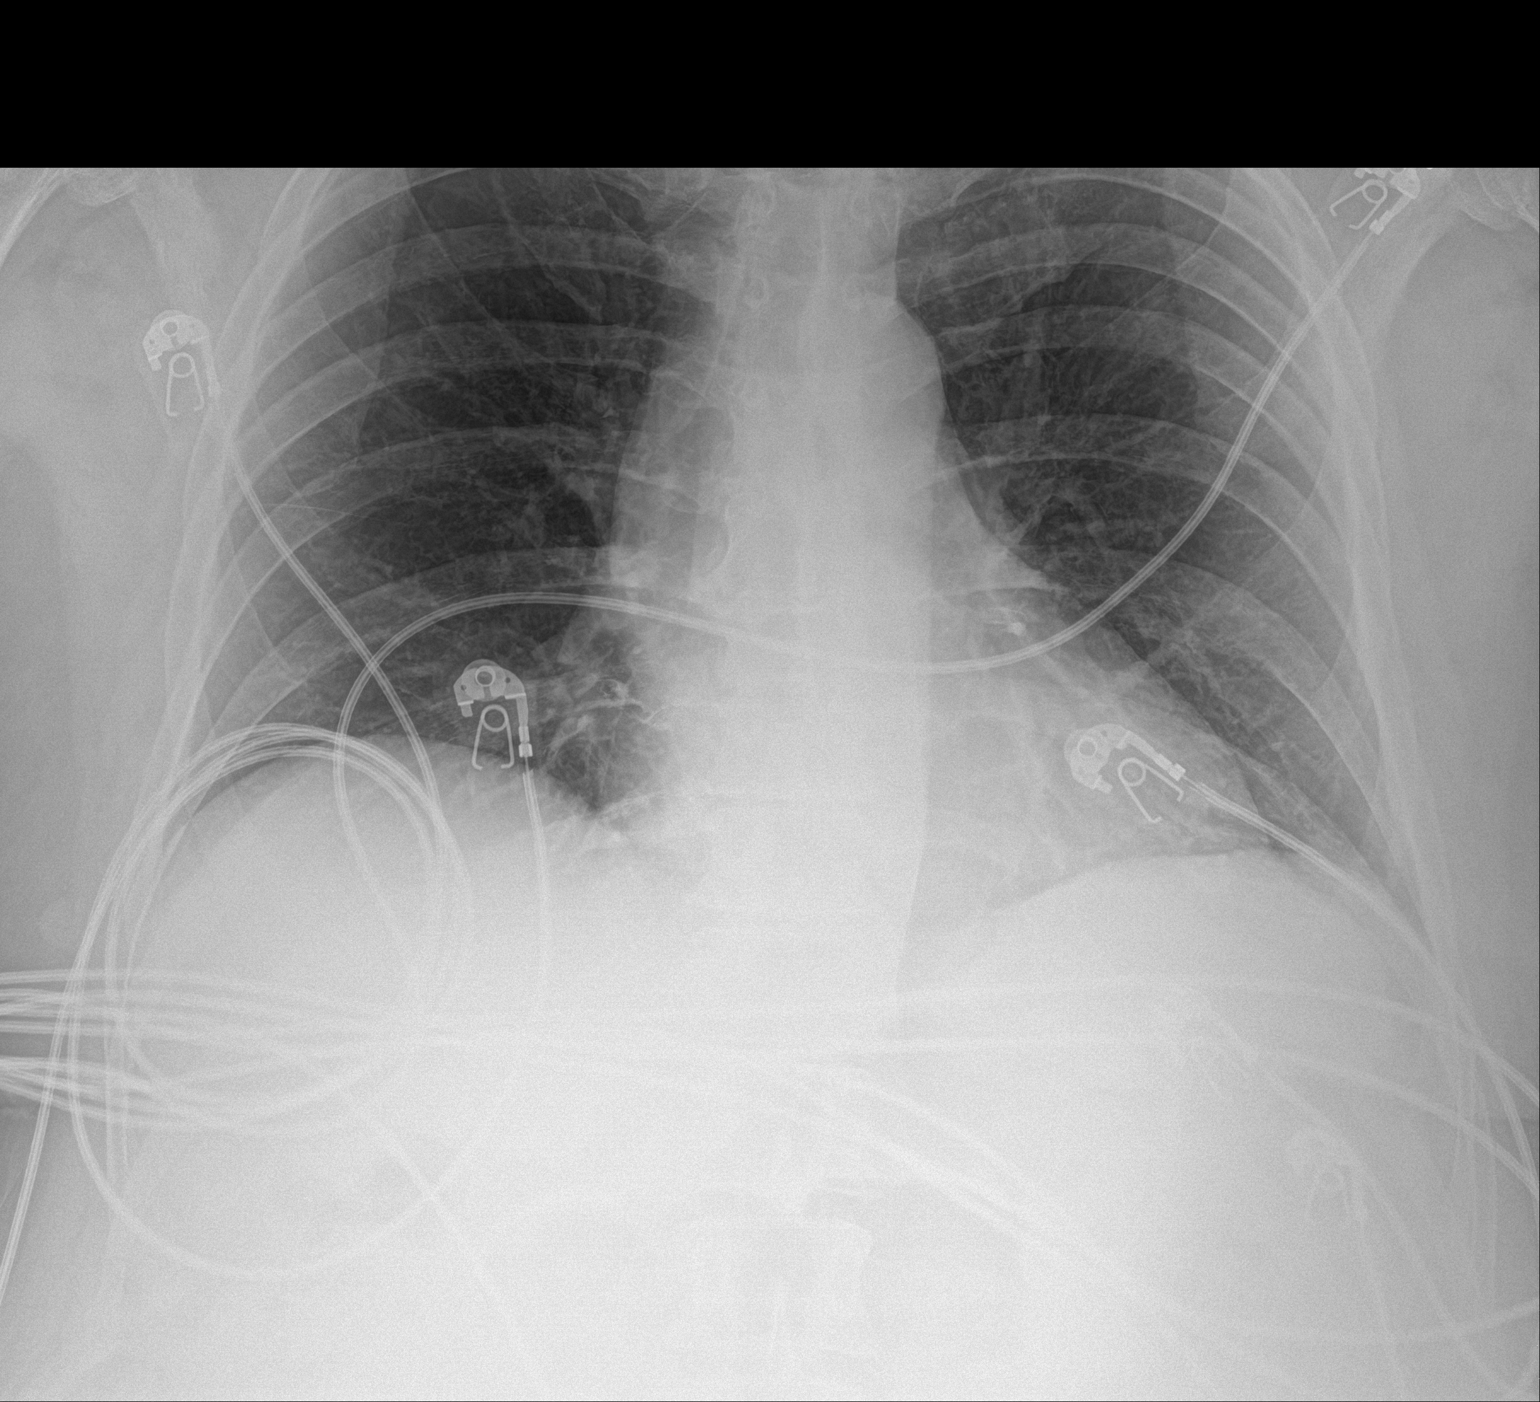

[1 of 1 positions shown; findings below may reference images not displayed]

FINDINGS: 0333 hours. Suboptimal inspiration with minimal bibasilar
atelectasis, similar to previous study. There is no confluent
airspace opacity, edema, pleural effusion or pneumothorax. The heart
size and mediastinal contours are stable. Previous cervical fusion
mild thoracic spondylosis are noted. Telemetry leads overlie the
chest.
IMPRESSION: Stable chest.  No active cardiopulmonary process.

## 2022-02-22 ENCOUNTER — Telehealth: Payer: Self-pay | Admitting: Genetic Counselor

## 2022-02-22 NOTE — Telephone Encounter (Signed)
Scheduled appt per 3/29 referral. Pt is aware of appt date and time. Pt is aware to arrive 15 mins prior to appt time and to bring and updated insurance card. Pt is aware of appt location.   ?

## 2022-03-29 ENCOUNTER — Inpatient Hospital Stay: Payer: Medicare Other

## 2022-03-29 ENCOUNTER — Inpatient Hospital Stay: Payer: Medicare Other | Attending: Genetic Counselor | Admitting: Genetic Counselor

## 2022-03-29 ENCOUNTER — Other Ambulatory Visit: Payer: Self-pay

## 2022-03-29 DIAGNOSIS — Z86010 Personal history of colonic polyps: Secondary | ICD-10-CM | POA: Diagnosis not present

## 2022-03-29 LAB — GENETIC SCREENING ORDER

## 2022-03-30 ENCOUNTER — Encounter: Payer: Self-pay | Admitting: Genetic Counselor

## 2022-03-30 DIAGNOSIS — Z860100 Personal history of colon polyps, unspecified: Secondary | ICD-10-CM | POA: Insufficient documentation

## 2022-03-30 NOTE — Progress Notes (Addendum)
REFERRING PROVIDER: ?Koleen Distance, MD ?[address] ?[place],  [place] [address] ? ?PRIMARY PROVIDER:  ?Patient, No Pcp Per (Inactive) ? ?PRIMARY REASON FOR VISIT:  ?1. Personal history of colonic polyps   ? ?HISTORY OF PRESENT ILLNESS:   ?Kristopher Kristopher Byrd, a 66 y.o. male, was seen for a [place] cancer genetics consultation at the request of Dr. Alan Ripper due to a personal history of polyps.  Kristopher Kristopher Byrd presents to clinic today to discuss the possibility of a hereditary predisposition to cancer, to discuss genetic testing, and to further clarify his future cancer risks, as well as potential cancer risks for family members.  ? ?Kristopher Kristopher Byrd is a 66 y.o. male with no personal history of cancer. Kristopher Kristopher Byrd colonoscopy in 2019 identified 4 colon polyps (multiple adenomas) and his 2023 colonoscopy identified 10 colon polyps (multiple adenomas).  ? ?CANCER HISTORY:  ?Oncology History  ? No history exists.  ? ?Past Medical History:  ?Diagnosis Date  ? BPH (benign prostatic hyperplasia)   ? CAD (coronary artery disease) 08/2016  ? DES LAD at [facility]  ? GERD (gastroesophageal reflux disease)   ? Hyperlipidemia   ? Hypertension   ? ? ?Past Surgical History:  ?Procedure Laterality Date  ? CORONARY ANGIOPLASTY WITH STENT PLACEMENT  2017  ? DES LAD at [facility]  ? LEFT HEART CATH AND CORONARY ANGIOGRAPHY N/A 09/09/2018  ? Procedure: LEFT HEART CATH AND CORONARY ANGIOGRAPHY;  Surgeon: Martinique, Peter M, MD;  Location: [place] [facility];  Service: Cardiovascular;  Laterality: N/A;  ? ? ?Social History  ? ?Socioeconomic History  ? Marital status: Married  ?  Spouse name: Not on file  ? Number of children: Not on file  ? Years of education: Not on file  ? Highest education level: Not on file  ?Occupational History  ? Not on file  ?Tobacco Use  ? Smoking status: Never  ? Smokeless tobacco: Never  ?Vaping Use  ? Vaping Use: Never used  ?Substance and Sexual Activity  ? Alcohol use: Never  ? Drug use: Never  ? Sexual activity: Not on file  ?Other Topics  Concern  ? Not on file  ?Social History Narrative  ? Not on file  ? ?Social Determinants of Health  ? ?Financial Resource Strain: Not on file  ?Food Insecurity: Not on file  ?Transportation Needs: Not on file  ?Physical Activity: Not on file  ?Stress: Not on file  ?Social Connections: Not on file  ?  ? ?FAMILY HISTORY:  ?We obtained a detailed, 4-generation family history.  Significant diagnoses are listed below: ?Family History  ?Problem Relation Age of Onset  ? Heart attack Mother 108  ?     died from complications  ? Unexplained death Father   ?     no info available  ? Breast cancer Kristopher Byrd 25  ? ? ? ? ? ?Kristopher Kristopher Byrd was diagnosed with breast cancer at age 56. Kristopher Kristopher Byrd is unaware of previous family history of genetic testing for hereditary cancer risks. There is no reported Ashkenazi Jewish ancestry.  ? ?GENETIC COUNSELING ASSESSMENT: Kristopher Kristopher Byrd is a 66 y.o. male with a personal history colon polyps which is somewhat suggestive of a hereditary predisposition to polyps given >10 colon polyps. We, therefore, discussed and recommended the following at today's visit.  ? ?DISCUSSION: We discussed that 5 - 10% of cancer is hereditary, with most cases of polyposis associated with APC and MUTYH.  There are other genes that can be associated  with colon polyps.  We discussed that testing is beneficial for several reasons including knowing how to follow individuals and understanding if other family members could be at risk for cancer and allowing them to undergo genetic testing.  ? ?We reviewed the characteristics, features and inheritance patterns of hereditary cancer syndromes. We also discussed genetic testing, including the appropriate family members to test, the process of testing, insurance coverage and turn-around-time for results. We discussed the implications of a negative, positive, carrier and/or variant of uncertain significant result. We recommended Kristopher Kristopher Byrd pursue genetic testing for a panel that includes  genes associated with polyposis.  ? ?Kristopher Kristopher Byrd was offered a polyposis panel or pan cancer panel. After considering the benefits and limitations of each gene panel, Kristopher Kristopher Byrd elected to have [place] Panel. The CancerNext gene panel offered by [employer] includes sequencing, rearrangement analysis, and RNA analysis for the following 36 genes:   APC, ATM, AXIN2, BARD1, BMPR1A, BRCA1, BRCA2, BRIP1, CDH1, CDK4, CDKN2A, CHEK2, DICER1, HOXB13, EPCAM, GREM1, MLH1, MSH2, MSH3, MSH6, MUTYH, NBN, NF1, NTHL1, PALB2, PMS2, POLD1, POLE, PTEN, RAD51C, RAD51D, RECQL, SMAD4, SMARCA4, STK11, and TP53.  ? ?Based on Mr. Gentle personal history of colon polyps, he meets medical criteria for genetic testing. Based on his insurance, [name] genetic testing should be of no cost.  ? ?PLAN: After considering the risks, benefits, and limitations, [name] provided informed consent to pursue genetic testing and the blood sample was sent to [facility] for analysis of the CancerNext Panel. Results should be available within approximately 2-3 weeks' time, at which point they will be disclosed by telephone to [name], as will any additional recommendations warranted by these results. Kristopher Kristopher Byrd will receive a summary of his genetic counseling visit and a copy of his results once available. This information will also be available in Epic.  ? ?[name]'s questions were answered to his satisfaction today. Our contact information was provided should additional questions or concerns arise. Thank you for the referral and allowing [place] to share in the care of your patient.  ? ?Lucille Passy, MS, [place] ?Genetic Counselor ?[email or web address] ?(P) [phone] ? ?The patient was seen for a total of 25 minutes in face-to-face genetic counseling. The patient was seen alone.  Drs. Lindi Adie and/or Burr Medico were available to discuss this case as needed.  ? ?_______________________________________________________________________ ?For  Office Staff:  ?Number of people involved in session: 1 ?Was an Intern/ student involved with case: no ? ?

## 2022-04-11 ENCOUNTER — Encounter: Payer: Self-pay | Admitting: Genetic Counselor

## 2022-04-11 ENCOUNTER — Telehealth: Payer: Self-pay | Admitting: Genetic Counselor

## 2022-04-11 DIAGNOSIS — Z1379 Encounter for other screening for genetic and chromosomal anomalies: Secondary | ICD-10-CM | POA: Insufficient documentation

## 2022-04-11 NOTE — Telephone Encounter (Signed)
I attempted to contact Mr. Levert to discuss his genetic testing results (36 genes). I left a voicemail requesting he call me back at [phone]. ? ?Lalla Brothers, MS, LCGC ?Genetic Counselor ?[email or web address] ?(P) [phone] ? ?

## 2022-04-11 NOTE — Telephone Encounter (Signed)
I contacted Mr. Kristopher Byrd to discuss his genetic testing results. No pathogenic variants were identified in the 36 genes analyzed. Detailed clinic note to follow. ? ?The test report has been scanned into EPIC and is located under the Molecular Pathology section of the Results Review tab.  A portion of the result report is included below for reference.  ? ?Lucille Passy, MS, [place] ?Genetic Counselor ?[email or web address] ?(P) [phone] ? ? ?

## 2022-04-20 ENCOUNTER — Ambulatory Visit: Payer: Self-pay | Admitting: Genetic Counselor

## 2022-04-20 NOTE — Progress Notes (Signed)
HPI:   Mr. Yankee was previously seen in the [place] clinic due to a personal history of colon polyps and concerns regarding a hereditary predisposition to cancer. Please refer to our prior cancer genetics clinic note for more information regarding our discussion, assessment and recommendations, at the time. Mr. Budai recent genetic test results were disclosed to him, as were recommendations warranted by these results. These results and recommendations are discussed in more detail below.  CANCER HISTORY:  Oncology History   No history exists.    FAMILY HISTORY:  We obtained a detailed, 4-generation family history.  Significant diagnoses are listed below:      Family History  Problem Relation Age of Onset   Heart attack Mother 46        died from complications   Unexplained death Father          no info available   Breast cancer Sister 43          Mr. Minerva's sister was diagnosed with breast cancer at age 61. Mr. Shin is unaware of previous family history of genetic testing for hereditary cancer risks. There is no reported Ashkenazi Jewish ancestry.     GENETIC TEST RESULTS:  The [facility] CancerNext Panel found no pathogenic mutations.   The CancerNext gene panel offered by [employer] includes sequencing, rearrangement analysis, and RNA analysis for the following 36 genes:   APC, ATM, AXIN2, BARD1, BMPR1A, BRCA1, BRCA2, BRIP1, CDH1, CDK4, CDKN2A, CHEK2, DICER1, HOXB13, EPCAM, GREM1, MLH1, MSH2, MSH3, MSH6, MUTYH, NBN, NF1, NTHL1, PALB2, PMS2, POLD1, POLE, PTEN, RAD51C, RAD51D, RECQL, SMAD4, SMARCA4, STK11, and TP53.   The test report has been scanned into EPIC and is located under the Molecular Pathology section of the Results Review tab.  A portion of the result report is included below for reference. Genetic testing reported out on 04/10/2022.       Even though a pathogenic variant was not identified, possible explanations for his personal history of colon  polyps may include: There may be no hereditary risk for colon polyps/cancer in the family. His colon polyps may be due to other genetic or environmental factors. There may be a gene mutation in one of these genes that current testing methods cannot detect, but that chance is small. There could be another gene that has not yet been discovered, or that we have not yet tested, that is responsible for the colon polyps/cancer diagnoses in the family.   Therefore, it is important to remain in touch with cancer genetics in the future so that we can continue to offer Mr. Piccione the most up to date genetic testing.   ADDITIONAL GENETIC TESTING:  We discussed with Mr. Thum that his genetic testing was fairly extensive.  If there are genes identified to increase colon polyp/cancer risk that can be analyzed in the future, we would be happy to discuss and coordinate this testing at that time.    CANCER SCREENING RECOMMENDATIONS:  Mr. Arganbright test result is considered negative (normal).  This means that we have not identified a hereditary cause for his personal history of colon polyps at this time. Most colon polyps/cancer happen by chance and this negative test suggests that his cancer may fall into this category.    An individual's cancer risk and medical management are not determined by genetic test results alone. Overall cancer risk assessment incorporates additional factors, including personal medical history, family history, and any available genetic information that may result in a personalized  plan for cancer prevention and surveillance. Therefore, it is recommended he continue to follow the cancer management and screening guidelines provided by his healthcare providers.  FOLLOW-UP:  Cancer genetics is a rapidly advancing field and it is possible that new genetic tests will be appropriate for him and/or his family members in the future. We encouraged him to remain in contact with cancer genetics on an annual  basis so we can update his personal and family histories and let him know of advances in cancer genetics that may benefit this family.   Our contact number was provided. Mr. Whitby questions were answered to his satisfaction, and he knows he is welcome to call us at anytime with additional questions or concerns.   Lucille Passy, MS, [facility] Genetic Counselor [email or web address] (P) [phone]

## 2022-04-25 ENCOUNTER — Encounter: Payer: Self-pay | Admitting: Genetic Counselor
